# Patient Record
Sex: Male | Born: 1975 | Race: White | Hispanic: No | Smoking: Current every day smoker
Health system: Southern US, Community
[De-identification: ages and names within clinical notes are randomized; demographics above are authoritative.]

## PROBLEM LIST (undated history)

## (undated) DIAGNOSIS — K219 Gastro-esophageal reflux disease without esophagitis: Secondary | ICD-10-CM

## (undated) DIAGNOSIS — F419 Anxiety disorder, unspecified: Secondary | ICD-10-CM

## (undated) DIAGNOSIS — I10 Essential (primary) hypertension: Secondary | ICD-10-CM

## (undated) DIAGNOSIS — G4700 Insomnia, unspecified: Secondary | ICD-10-CM

## (undated) DIAGNOSIS — B019 Varicella without complication: Secondary | ICD-10-CM

## (undated) DIAGNOSIS — G4730 Sleep apnea, unspecified: Secondary | ICD-10-CM

## (undated) HISTORY — DX: Sleep apnea, unspecified: G47.30

## (undated) HISTORY — DX: Essential (primary) hypertension: I10

## (undated) HISTORY — PX: WISDOM TOOTH EXTRACTION: SHX21

## (undated) HISTORY — DX: Varicella without complication: B01.9

## (undated) HISTORY — DX: Gastro-esophageal reflux disease without esophagitis: K21.9

## (undated) HISTORY — DX: Anxiety disorder, unspecified: F41.9

## (undated) HISTORY — DX: Insomnia, unspecified: G47.00

---

## 2012-09-25 LAB — BASIC METABOLIC PANEL
CREATININE: 0.8 mg/dL (ref 0.6–1.3)
Glucose: 81 mg/dL
Potassium: 4.7 mmol/L (ref 3.4–5.3)

## 2012-09-25 LAB — HEPATIC FUNCTION PANEL
ALK PHOS: 82 U/L (ref 25–125)
ALT: 19 U/L (ref 10–40)
AST: 19 U/L (ref 14–40)

## 2012-09-25 LAB — CBC AND DIFFERENTIAL
Hemoglobin: 15.1 g/dL (ref 13.5–17.5)
PLATELETS: 252 10*3/uL (ref 150–399)
WBC: 5.9 10*3/mL

## 2012-09-25 LAB — LIPID PANEL
CHOLESTEROL: 209 mg/dL — AB (ref 0–200)
HDL: 64 mg/dL (ref 35–70)
LDL Cholesterol: 100 mg/dL
Triglycerides: 224 mg/dL — AB (ref 40–160)

## 2014-10-24 ENCOUNTER — Ambulatory Visit (INDEPENDENT_AMBULATORY_CARE_PROVIDER_SITE_OTHER): Payer: Managed Care, Other (non HMO) | Admitting: Family Medicine

## 2014-10-24 ENCOUNTER — Encounter: Payer: Self-pay | Admitting: Family Medicine

## 2014-10-24 VITALS — BP 136/89 | HR 89 | Temp 98.0°F | Ht 70.0 in | Wt 172.0 lb

## 2014-10-24 DIAGNOSIS — F411 Generalized anxiety disorder: Secondary | ICD-10-CM

## 2014-10-24 DIAGNOSIS — E785 Hyperlipidemia, unspecified: Secondary | ICD-10-CM

## 2014-10-24 DIAGNOSIS — R739 Hyperglycemia, unspecified: Secondary | ICD-10-CM

## 2014-10-24 DIAGNOSIS — F419 Anxiety disorder, unspecified: Secondary | ICD-10-CM | POA: Insufficient documentation

## 2014-10-24 DIAGNOSIS — I10 Essential (primary) hypertension: Secondary | ICD-10-CM | POA: Insufficient documentation

## 2014-10-24 DIAGNOSIS — Z0000 Encounter for general adult medical examination without abnormal findings: Secondary | ICD-10-CM

## 2014-10-24 MED ORDER — TRAZODONE HCL 50 MG PO TABS: 50.0000 mg | ORAL_TABLET | Freq: Every evening | ORAL | Status: DC | PRN

## 2014-10-24 MED ORDER — METOPROLOL SUCCINATE ER 50 MG PO TB24: 50.0000 mg | ORAL_TABLET | Freq: Every day | ORAL | Status: DC

## 2014-10-24 MED ORDER — CLONAZEPAM 0.5 MG PO TABS: 0.5000 mg | ORAL_TABLET | Freq: Every day | ORAL | Status: DC | PRN

## 2014-10-24 NOTE — Patient Instructions (Signed)
Dr. Lajoyce Lauber General Advice Following Your Complete Physical Exam  The Benefits of Regular Exercise: Unless you suffer from an uncontrolled cardiovascular condition, studies strongly suggest that regular exercise and physical activity will add to both the quality and length of your life.  The World Health Organization recommends 150 minutes of moderate intensity aerobic activity every week.  This is best split over 3-4 days a week, and can be as simple as a brisk walk for just over 35 minutes "most days of the week".  This type of exercise has been shown to lower LDL-Cholesterol, lower average blood sugars, lower blood pressure, lower cardiovascular disease risk, improve memory, and increase one's overall sense of wellbeing.  The addition of anaerobic (or "strength training") exercises offers additional benefits including but not limited to increased metabolism, prevention of osteoporosis, and improved overall cholesterol levels.  How Can I Strive For A Low-Fat Diet?: Current guidelines recommend that 25-35 percent of your daily energy (food) intake should come from fats.  One might ask how can this be achieved without having to dissect each meal on a daily basis?  Switch to skim or 1% milk instead of whole milk.  Focus on lean meats such as ground [place], fresh fish, baked chicken, and lean cuts of beef as your source of dietary protein.  Limit saturated fat consumption to less than 10% of your daily caloric intake.  Limit trans fatty acid consumption primarily by limiting synthetic trans fats such as partially hydrogenated oils (Ex: fried fast foods).  Substitute olive or vegetable oil for solid fats where possible.  Moderation of Salt Intake: Provided you don't carry a diagnosis of congestive heart failure nor renal failure, I recommend a daily allowance of no more than 2300 mg of salt (sodium).  Keeping under this daily goal is associated with a decreased risk of cardiovascular events, creeping  above it can lead to elevated blood pressures and increases your risk of cardiovascular events.  Milligrams (mg) of salt is listed on all nutrition labels, and your daily intake can add up faster than you think.  Most canned and frozen dinners can pack in over half your daily salt allowance in one meal.    Lifestyle Health Risks: Certain lifestyle choices carry specific health risks.  As you may already know, tobacco use has been associated with increasing one's risk of cardiovascular disease, pulmonary disease, numerous cancers, among many other issues.  What you may not know is that there are medications and nicotine replacement strategies that can more than double your chances of successfully quitting.  I would be thrilled to help manage your quitting strategy if you currently use tobacco products.  When it comes to alcohol use, I've yet to find an "ideal" daily allowance.  Provided an individual does not have a medical condition that is exacerbated by alcohol consumption, general guidelines determine "safe drinking" as no more than two standard drinks for a man or no more than one standard drink for a male per day.  However, much debate still exists on whether any amount of alcohol consumption is technically "safe".  My general advice, keep alcohol consumption to a minimum for general health promotion.  If you or others believe that alcohol, tobacco, or recreational drug use is interfering with your life, I would be happy to provide confidential counseling regarding treatment options.  General "Over The Counter" Nutrition Advice: Postmenopausal women should aim for a daily calcium intake of 1200 mg, however a significant portion of this might already be  provided by diets including milk, yogurt, cheese, and other dairy products.  Vitamin D has been shown to help preserve bone density, prevent fatigue, and has even been shown to help reduce falls in the elderly.  Ensuring a daily intake of 800 Units of  Vitamin D is a good place to start to enjoy the above benefits, we can easily check your Vitamin D level to see if you'd potentially benefit from supplementation beyond 800 Units a day.  Folic Acid intake should be of particular concern to women of childbearing age.  Daily consumption of 295-188 mcg of Folic Acid is recommended to minimize the chance of spinal cord defects in a fetus should pregnancy occur.    For many adults, accidents still remain one of the most common culprits when it comes to cause of death.  Some of the simplest but most effective preventitive habits you can adopt include regular seatbelt use, proper helmet use, securing firearms, and regularly testing your smoke and carbon monoxide detectors.  Zaleigh Bermingham B. [place] [address], [place], [place] [address] Phone: [phone]

## 2014-10-24 NOTE — Progress Notes (Signed)
CC: Shane Matthews is a 38 y.o. male is here for Establish Care   Subjective: HPI:  Colonoscopy: No family history of colon cancer will begin screening at age 47 Prostate: Discussed screening risks/beneifts with patient today, no family history of prostate cancer will begin screening at age 24   Influenza Vaccine: got in November Pneumovax: No current indication Td/Tdap: Tdap 2010 Zoster: (Start 40 yo)  Pleasant 38 year old here to establish care with no acute complaints  History of hypertension has only been on metoprolol with her outside blood pressures report but historically it has been well controlled on metoprolol 50 mg daily  Reports history of anxiety that he takes clonazepam for no more than once a day and often will skip days if he is not feeling particularly anxious. States anxiety does not interfere with quality of life on this regimen. This is accompanied by insomnia which he takes trazodone for, difficulty falling asleep that is also influenced by anxiety. Difficulty is alleviated if he takes 50 mg of trazodone  Review of Systems - General ROS: negative for - chills, fever, night sweats, weight gain or weight loss Ophthalmic ROS: negative for - decreased vision Psychological ROS: negative for - depression ENT ROS: negative for - hearing change, nasal congestion, tinnitus or allergies Hematological and Lymphatic ROS: negative for - bleeding problems, bruising or swollen lymph nodes Breast ROS: negative Respiratory ROS: no cough, shortness of breath, or wheezing Cardiovascular ROS: no chest pain or dyspnea on exertion Gastrointestinal ROS: no abdominal pain, change in bowel habits, or black or bloody stools Genito-Urinary ROS: negative for - genital discharge, genital ulcers, incontinence or abnormal bleeding from genitals Musculoskeletal ROS: negative for - joint pain or muscle pain Neurological ROS: negative for - headaches or memory loss Dermatological ROS: negative  for lumps, mole changes, rash and skin lesion changes  Past Medical History  Diagnosis Date  . HTN (hypertension)   . Anxiety   . Insomnia     No past surgical history on file. No family history on file.  History   Social History  . Marital Status: Divorced    Spouse Name: N/A    Number of Children: N/A  . Years of Education: N/A   Occupational History  . Not on file.   Social History Main Topics  . Smoking status: Current Every Day Smoker -- 1.00 packs/day for 20 years    Types: Cigarettes  . Smokeless tobacco: Not on file  . Alcohol Use: 0.6 - 1.8 oz/week    1-3 Not specified per week  . Drug Use: No  . Sexual Activity: Not on file   Other Topics Concern  . Not on file   Social History Narrative  . No narrative on file     Objective: BP 136/89 mmHg  Pulse 89  Temp(Src) 98 F (36.7 C)  Ht 5' 10"  (1.778 m)  Wt 172 lb (78.019 kg)  BMI 24.68 kg/m2  SpO2 97%  General: No Acute Distress HEENT: Atraumatic, normocephalic, conjunctivae normal without scleral icterus.  No nasal discharge, hearing grossly intact, TMs with good landmarks bilaterally with no middle ear abnormalities, posterior pharynx clear without oral lesions. Neck: Supple, trachea midline, no cervical nor supraclavicular adenopathy. Pulmonary: Clear to auscultation bilaterally without wheezing, rhonchi, nor rales. Cardiac: Regular rate and rhythm.  No murmurs, rubs, nor gallops. No peripheral edema.  2+ peripheral pulses bilaterally. Abdomen: Bowel sounds normal.  No masses.  Non-tender without rebound.  Negative Murphy's sign. MSK: Grossly intact, no signs of  weakness.  Full strength throughout upper and lower extremities.  Full ROM in upper and lower extremities.  No midline spinal tenderness. Neuro: Gait unremarkable, CN II-XII grossly intact.  C5-C6 Reflex 2/4 Bilaterally, L4 Reflex 2/4 Bilaterally.  Cerebellar function intact. Skin: No rashes. Psych: Alert and oriented to person/place/time.   Thought process normal. No depression however appears mildly anxious  Assessment & Plan: Coron was seen today for establish care.  Diagnoses and associated orders for this visit:  Annual physical exam - Lipid panel - COMPLETE METABOLIC PANEL WITH GFR - CBC - Hemoglobin A1c  Hyperglycemia - Hemoglobin A1c  Hyperlipidemia  Generalized anxiety disorder  Essential hypertension  Other Orders - metoprolol succinate (TOPROL-XL) 50 MG 24 hr tablet; Take 1 tablet (50 mg total) by mouth daily. Take with or immediately following a meal. - traZODone (DESYREL) 50 MG tablet; Take 1 tablet (50 mg total) by mouth at bedtime as needed for sleep. - clonazePAM (KLONOPIN) 0.5 MG tablet; Take 1 tablet (0.5 mg total) by mouth daily as needed for anxiety.    Healthy lifestyle interventions including but not limited to regular exercise, a healthy low fat diet, moderation of salt intake, the dangers of tobacco/alcohol/recreational drug use, nutrition supplementation, and accident avoidance were discussed with the patient and a handout was provided for future reference. Checking an A1c due to family history of type 2 diabetes and a elevated blood sugar just above 102 years ago.  Essential hypertension: Controlled continue metoprolol Anxiety: Controlled continue Klonopin Insomnia: Controlled continue trazodone  No Follow-up on file.

## 2015-01-15 ENCOUNTER — Encounter: Payer: Self-pay | Admitting: Family Medicine

## 2015-01-26 ENCOUNTER — Emergency Department (HOSPITAL_COMMUNITY)
Admission: EM | Admit: 2015-01-26 | Discharge: 2015-01-26 | Discharge status: Absent without leave | Payer: Managed Care, Other (non HMO) | Source: Home / Self Care

## 2015-01-30 ENCOUNTER — Encounter: Payer: Self-pay | Admitting: Family Medicine

## 2015-01-30 ENCOUNTER — Ambulatory Visit (INDEPENDENT_AMBULATORY_CARE_PROVIDER_SITE_OTHER): Payer: Managed Care, Other (non HMO) | Admitting: Family Medicine

## 2015-01-30 ENCOUNTER — Ambulatory Visit (INDEPENDENT_AMBULATORY_CARE_PROVIDER_SITE_OTHER): Payer: Managed Care, Other (non HMO)

## 2015-01-30 VITALS — BP 135/80 | HR 138 | Temp 99.0°F | Wt 169.0 lb

## 2015-01-30 DIAGNOSIS — R0602 Shortness of breath: Secondary | ICD-10-CM

## 2015-01-30 DIAGNOSIS — R509 Fever, unspecified: Secondary | ICD-10-CM

## 2015-01-30 DIAGNOSIS — R05 Cough: Secondary | ICD-10-CM

## 2015-01-30 DIAGNOSIS — R059 Cough, unspecified: Secondary | ICD-10-CM

## 2015-01-30 DIAGNOSIS — J189 Pneumonia, unspecified organism: Secondary | ICD-10-CM

## 2015-01-30 LAB — POCT INFLUENZA A/B
Influenza A, POC: NEGATIVE
Influenza B, POC: NEGATIVE

## 2015-01-30 MED ORDER — LEVOFLOXACIN 500 MG PO TABS: 750.0000 mg | ORAL_TABLET | Freq: Every day | ORAL | Status: DC

## 2015-01-30 MED ORDER — HYDROCODONE-HOMATROPINE 5-1.5 MG/5ML PO SYRP: 5.0000 mL | ORAL_SOLUTION | Freq: Three times a day (TID) | ORAL | Status: DC | PRN

## 2015-01-30 NOTE — Progress Notes (Signed)
CC: Shane Matthews is a 39 y.o. male is here for fever since friday   Subjective: HPI:  Productive cough and fever that has been present since Thursday of last week. Temperature has gotten high as 102.5. Reports fatigue, chills, decreased appetite but able to tolerate fluids. Symptoms have been moderate in severity and always been doing the sitting at home. Difficulty staying asleep due to the above symptoms waking him up. No benefit from ibuprofen or Tylenol. No other interventions as of yet. Symptoms began 2-3 days after he returned from [place], no other travel or known sick contacts. Denies shortness of breath however is significant other today tells me that he is visibly out of breath doing easy household tasks. He denies chest pain, confusion, nasal congestion, sore throat, facial pressure, rashes, diarrhea constipation nor burning when urinating.   Review Of Systems Outlined In HPI  Past Medical History  Diagnosis Date  . HTN (hypertension)   . Anxiety   . Insomnia     No past surgical history on file. No family history on file.  History   Social History  . Marital Status: Divorced    Spouse Name: N/A  . Number of Children: N/A  . Years of Education: N/A   Occupational History  . Not on file.   Social History Main Topics  . Smoking status: Current Every Day Smoker -- 1.00 packs/day for 20 years    Types: Cigarettes  . Smokeless tobacco: Not on file  . Alcohol Use: 0.6 - 1.8 oz/week    1-3 Standard drinks or equivalent per week  . Drug Use: No  . Sexual Activity: Not on file   Other Topics Concern  . Not on file   Social History Narrative     Objective: BP 135/80 mmHg  Pulse 138  Temp(Src) 99 F (37.2 C) (Oral)  Wt 169 lb (76.658 kg)  General: Alert and Oriented, No Acute Distress but appears moderately tired HEENT: Pupils equal, round, reactive to light. Conjunctivae clear.  External ears unremarkable, canals clear with intact TMs with appropriate  landmarks.  Middle ear appears open without effusion. Pink inferior turbinates.  Moist mucous membranes, pharynx without inflammation nor lesions.  Neck supple without palpable lymphadenopathy nor abnormal masses. Lungs: Clear to auscultation bilaterally, no wheezing/ronchi/rales.  Good air movement Cardiac: mild tachycardia at a regular rhythm. Normal S1/S2.  No murmurs, rubs, nor gallops.   Extremities: No peripheral edema.  Strong peripheral pulses.  Mental Status: No depression, anxiety, nor agitation. Skin: Warm and dry.  Assessment & Plan: [name] was seen today for fever since friday.  Diagnoses and all orders for this visit:  Fever, unspecified fever cause Orders: -     POCT Influenza A/B -     DG Chest 2 View; Future  Shortness of breath Orders: -     DG Chest 2 View; Future  Cough Orders: -     DG Chest 2 View; Future  CAP (community acquired pneumonia) Orders: -     levofloxacin (LEVAQUIN) 500 MG tablet; Take 1.5 tablets (750 mg total) by mouth daily. -     HYDROcodone-homatropine (HYCODAN) 5-1.5 MG/5ML syrup; Take 5 mLs by mouth every 8 (eight) hours as needed for cough.   Rapid flu test negative, chest x-ray was obtained which shows bilateral pulmonary infiltrates. He'll be started on levofloxacin for community-acquired pneumonia. Will also be provided with Hycodan to help with cough that's interfering with sleep. I've asked him to call me if he is absolutely no  better or worse on Thursday and if that's the case I be happy to work him into my schedule even I'm already booked.Signs and symptoms requring emergent/urgent reevaluation were discussed with the patient.   I'd be happy to complete FMLA paperwork to keep him out of work from Monday through Thursday of this week.  Return if symptoms worsen or fail to improve.

## 2015-02-02 ENCOUNTER — Telehealth: Payer: Self-pay | Admitting: *Deleted

## 2015-02-02 NOTE — Telephone Encounter (Signed)
Sent message to Dr Ileene Rubens concerning patient still havin fevers 2-3 x day

## 2015-02-02 NOTE — Telephone Encounter (Signed)
As long as he is physically feeling somewhat better and shortness of breath is improving a fever for the first three days is not out of the ordinary.  I'd recommend he continue with the levofloxacin and if still having fevers over the weekend visit our urgent care clinic since we won't be open.

## 2015-02-02 NOTE — Telephone Encounter (Signed)
Spoke with patient & informed that Dr Ileene Rubens says: As long as he is physically feeling somewhat better and shortness of breath is improving a fever for the first three days is not out of the ordinary. I'd recommend he continue with the levofloxacin and if still having fevers over the weekend visit our urgent care clinic since we won't be open.

## 2015-05-11 ENCOUNTER — Other Ambulatory Visit: Payer: Self-pay | Admitting: *Deleted

## 2015-05-11 MED ORDER — METOPROLOL SUCCINATE ER 50 MG PO TB24: 50.0000 mg | ORAL_TABLET | Freq: Every day | ORAL | Status: DC

## 2015-05-29 ENCOUNTER — Ambulatory Visit (INDEPENDENT_AMBULATORY_CARE_PROVIDER_SITE_OTHER): Payer: Managed Care, Other (non HMO) | Admitting: Family Medicine

## 2015-05-29 ENCOUNTER — Encounter: Payer: Self-pay | Admitting: Family Medicine

## 2015-05-29 VITALS — BP 171/103 | HR 81 | Wt 166.0 lb

## 2015-05-29 DIAGNOSIS — F411 Generalized anxiety disorder: Secondary | ICD-10-CM | POA: Diagnosis not present

## 2015-05-29 DIAGNOSIS — I10 Essential (primary) hypertension: Secondary | ICD-10-CM

## 2015-05-29 MED ORDER — CLONAZEPAM 0.5 MG PO TABS: 0.5000 mg | ORAL_TABLET | Freq: Every day | ORAL | Status: DC | PRN

## 2015-05-29 MED ORDER — TRAZODONE HCL 50 MG PO TABS: 50.0000 mg | ORAL_TABLET | Freq: Every evening | ORAL | Status: DC | PRN

## 2015-05-29 NOTE — Progress Notes (Signed)
CC: Shane Matthews is a 39 y.o. male is here for Anxiety   Subjective: HPI:  Essential hypertension: Taking metoprolol 50 mg on a daily basis. No outside blood pressures to report. No chest pain shortness of breath orthopnea nor peripheral edema.  Follow-up anxiety: He continues to take clonazepam a few days a week. He's tried to go 2 weeks now without taking it to see if he still needs to be on it and almost every other day he gets stressed out at work and feels like he probably should take a dose of it. He's done with this experiment and wants to go back to no longer restricting himself from this medication. He does well if he takes just a few times a week. He noticed that trazodone seemed to stop helping withstaying asleep so he stopped taking it a few months ago.denies depression, nor paranoia.  Review Of Systems Outlined In HPI  Past Medical History  Diagnosis Date  . HTN (hypertension)   . Anxiety   . Insomnia     No past surgical history on file. No family history on file.  History   Social History  . Marital Status: Divorced    Spouse Name: N/A  . Number of Children: N/A  . Years of Education: N/A   Occupational History  . Not on file.   Social History Main Topics  . Smoking status: Current Every Day Smoker -- 1.00 packs/day for 20 years    Types: Cigarettes  . Smokeless tobacco: Not on file  . Alcohol Use: 0.6 - 1.8 oz/week    1-3 Standard drinks or equivalent per week  . Drug Use: No  . Sexual Activity: Not on file   Other Topics Concern  . Not on file   Social History Narrative     Objective: BP 171/103 mmHg  Pulse 81  Wt 166 lb (75.297 kg)  General: Alert and Oriented, No Acute Distress HEENT: Pupils equal, round, reactive to light. Conjunctivae clear.  Moist mucous membranes Lungs: Clear to auscultation bilaterally, no wheezing/ronchi/rales.  Comfortable work of breathing. Good air movement. Cardiac: Regular rate and rhythm. Normal S1/S2.  No  murmurs, rubs, nor gallops.   Extremities: No peripheral edema.  Strong peripheral pulses.  Mental Status: No depression, anxiety, nor agitation. Skin: Warm and dry.  Assessment & Plan: Shane Matthews was seen today for anxiety.  Diagnoses and all orders for this visit:  Essential hypertension  Generalized anxiety disorder  Other orders -     traZODone (DESYREL) 50 MG tablet; Take 1-2 tablets (50-100 mg total) by mouth at bedtime as needed for sleep. -     clonazePAM (KLONOPIN) 0.5 MG tablet; Take 1 tablet (0.5 mg total) by mouth daily as needed for anxiety.   Essential hypertension: Uncontrolled chronic condition, he strongly believes that his blood pressure is normal outside of our office. To test this have asked him to write down his blood pressure on 4 separate days and provided to me within the next 1 or 2 weeks to help me determine if he needs to go up on his metoprolol. Anxiety: Controlled with clonazepam, component of insomnia as uncontrolled therefore increasing trazodone  Return in about 3 months (around 08/29/2015).

## 2015-05-29 NOTE — Patient Instructions (Signed)
Please write down four random blood pressures from four separate days:               .

## 2015-09-10 ENCOUNTER — Other Ambulatory Visit: Payer: Self-pay | Admitting: Family Medicine

## 2015-10-02 ENCOUNTER — Ambulatory Visit (INDEPENDENT_AMBULATORY_CARE_PROVIDER_SITE_OTHER): Payer: Managed Care, Other (non HMO) | Admitting: Family Medicine

## 2015-10-02 ENCOUNTER — Encounter: Payer: Self-pay | Admitting: Family Medicine

## 2015-10-02 VITALS — BP 160/97 | HR 76 | Wt 175.0 lb

## 2015-10-02 DIAGNOSIS — I10 Essential (primary) hypertension: Secondary | ICD-10-CM | POA: Diagnosis not present

## 2015-10-02 DIAGNOSIS — F411 Generalized anxiety disorder: Secondary | ICD-10-CM | POA: Diagnosis not present

## 2015-10-02 MED ORDER — CLONAZEPAM 0.5 MG PO TABS: 0.5000 mg | ORAL_TABLET | Freq: Every day | ORAL | Status: DC | PRN

## 2015-10-02 MED ORDER — METOPROLOL SUCCINATE ER 100 MG PO TB24: 100.0000 mg | ORAL_TABLET | Freq: Every day | ORAL | Status: DC

## 2015-10-02 NOTE — Progress Notes (Signed)
CC: Shane Matthews is a 39 y.o. male is here for Hypertension and Medication Refill   Subjective: HPI:   Follow-up anxiety:  His only been taking clonazepam on stressful days when he is at work. He typically does not take it on the weekend. They'll obtain the trigger his anxiety is the stress at work. Symptoms are not interfering with his quality of life with his current use of this medication. He denies any depression or sleep disturbance. Denies unintentional weight loss or gain.  Follow-up essential hypertension: Continues to take metoprolol 50 mg daily. Denies any known side effects. Denies chest pain shortness of breath orthopnea nor peripheral edema. He's noticed that blood pressures are usually in the stage I hypertension range when checked at home.   Review Of Systems Outlined In HPI  Past Medical History  Diagnosis Date  . HTN (hypertension)   . Anxiety   . Insomnia     No past surgical history on file. No family history on file.  Social History   Social History  . Marital Status: Divorced    Spouse Name: N/A  . Number of Children: N/A  . Years of Education: N/A   Occupational History  . Not on file.   Social History Main Topics  . Smoking status: Current Every Day Smoker -- 1.00 packs/day for 20 years    Types: Cigarettes  . Smokeless tobacco: Not on file  . Alcohol Use: 0.6 - 1.8 oz/week    1-3 Standard drinks or equivalent per week  . Drug Use: No  . Sexual Activity: Not on file   Other Topics Concern  . Not on file   Social History Narrative     Objective: BP 160/97 mmHg  Pulse 76  Wt 175 lb (79.379 kg)  General: Alert and Oriented, No Acute Distress HEENT: Pupils equal, round, reactive to light. Conjunctivae clear. Moist mucous membranes Lungs: Clear to auscultation bilaterally, no wheezing/ronchi/rales.  Comfortable work of breathing. Good air movement. Cardiac: Regular rate and rhythm. Normal S1/S2.  No murmurs, rubs, nor gallops.    Extremities: No peripheral edema.  Strong peripheral pulses.  Mental Status: No depression, anxiety, nor agitation. Skin: Warm and dry.  Assessment & Plan: Shane Matthews was seen today for hypertension and medication refill.  Diagnoses and all orders for this visit:  Generalized anxiety disorder  Essential hypertension  Other orders -     clonazePAM (KLONOPIN) 0.5 MG tablet; Take 1 tablet (0.5 mg total) by mouth daily as needed for anxiety. -     Cancel: metoprolol succinate (TOPROL-XL) 50 MG 24 hr tablet; Take with or immediately following a meal. -     metoprolol succinate (TOPROL-XL) 100 MG 24 hr tablet; Take 1 tablet (100 mg total) by mouth daily. Take with or immediately following a meal.   Anxiety: Controlled continue as needed clonazepam Essential hypertension: uncontrolled chronic condition increasing metoprolol.  Return for December or January Fasting Physical.

## 2015-11-09 ENCOUNTER — Encounter: Payer: Managed Care, Other (non HMO) | Admitting: Family Medicine

## 2015-11-19 ENCOUNTER — Encounter: Payer: Self-pay | Admitting: Family Medicine

## 2015-11-19 ENCOUNTER — Ambulatory Visit (INDEPENDENT_AMBULATORY_CARE_PROVIDER_SITE_OTHER): Payer: Managed Care, Other (non HMO) | Admitting: Family Medicine

## 2015-11-19 VITALS — BP 151/98 | HR 68 | Wt 176.0 lb

## 2015-11-19 DIAGNOSIS — Z0000 Encounter for general adult medical examination without abnormal findings: Secondary | ICD-10-CM | POA: Diagnosis not present

## 2015-11-19 MED ORDER — METOPROLOL SUCCINATE ER 100 MG PO TB24: 100.0000 mg | ORAL_TABLET | Freq: Every day | ORAL | Status: DC

## 2015-11-19 MED ORDER — CLONAZEPAM 0.5 MG PO TABS: 0.5000 mg | ORAL_TABLET | Freq: Every day | ORAL | Status: DC | PRN

## 2015-11-19 NOTE — Progress Notes (Signed)
CC: Shane Matthews is a 40 y.o. male is here for Annual Exam   Subjective: HPI:  Colonoscopy: no current indication Prostate: Discussed screening risks/beneifts with patienttoday, no current indication for screening  Influenza Vaccine: UTD Pneumovax:  Td/Tdap: UTD Zoster: (Start 42 yo)  Requesting complete physical exam with no complaints but requesting refills on metoprolol and clonazepam.  Review of Systems - General ROS: negative for - chills, fever, night sweats, weight gain or weight loss Ophthalmic ROS: negative for - decreased vision Psychological ROS: negative for - anxiety or depression ENT ROS: negative for - hearing change, nasal congestion, tinnitus or allergies Hematological and Lymphatic ROS: negative for - bleeding problems, bruising or swollen lymph nodes Breast ROS: negative Respiratory ROS: no cough, shortness of breath, or wheezing Cardiovascular ROS: no chest pain or dyspnea on exertion Gastrointestinal ROS: no abdominal pain, change in bowel habits, or black or bloody stools Genito-Urinary ROS: negative for - genital discharge, genital ulcers, incontinence or abnormal bleeding from genitals Musculoskeletal ROS: negative for - joint pain or muscle pain Neurological ROS: negative for - headaches or memory loss Dermatological ROS: negative for lumps, mole changes, rash and skin lesion changes  Past Medical History  Diagnosis Date  . HTN (hypertension)   . Anxiety   . Insomnia     No past surgical history on file. No family history on file.  Social History   Social History  . Marital Status: Divorced    Spouse Name: N/A  . Number of Children: N/A  . Years of Education: N/A   Occupational History  . Not on file.   Social History Main Topics  . Smoking status: Current Every Day Smoker -- 1.00 packs/day for 20 years    Types: Cigarettes  . Smokeless tobacco: Not on file  . Alcohol Use: 0.6 - 1.8 oz/week    1-3 Standard drinks or equivalent per week   . Drug Use: No  . Sexual Activity: Not on file   Other Topics Concern  . Not on file   Social History Narrative     Objective: BP 151/98 mmHg  Pulse 68  Wt 176 lb (79.833 kg)  General: No Acute Distress HEENT: Atraumatic, normocephalic, conjunctivae normal without scleral icterus.  No nasal discharge, hearing grossly intact, TMs with good landmarks bilaterally with no middle ear abnormalities, posterior pharynx clear without oral lesions. Neck: Supple, trachea midline, no cervical nor supraclavicular adenopathy. Pulmonary: Clear to auscultation bilaterally without wheezing, rhonchi, nor rales. Cardiac: Regular rate and rhythm.  No murmurs, rubs, nor gallops. No peripheral edema.  2+ peripheral pulses bilaterally. Abdomen: Bowel sounds normal.  No masses.  Non-tender without rebound.  Negative Murphy's sign. MSK: Grossly intact, no signs of weakness.  Full strength throughout upper and lower extremities.  Full ROM in upper and lower extremities.  No midline spinal tenderness. Neuro: Gait unremarkable, CN II-XII grossly intact.  C5-C6 Reflex 2/4 Bilaterally, L4 Reflex 2/4 Bilaterally.  Cerebellar function intact. Skin: No rashes. Psych: Alert and oriented to person/place/time.  Thought process normal. No anxiety/depression. Assessment & Plan: Fidel was seen today for annual exam.  Diagnoses and all orders for this visit:  Annual physical exam -     Lipid panel -     COMPLETE METABOLIC PANEL WITH GFR -     CBC  Other orders -     clonazePAM (KLONOPIN) 0.5 MG tablet; Take 1 tablet (0.5 mg total) by mouth daily as needed for anxiety. -     metoprolol succinate (TOPROL-XL)  100 MG 24 hr tablet; Take 1 tablet (100 mg total) by mouth daily. Take with or immediately following a meal.   Healthy lifestyle interventions including but not limited to regular exercise, a healthy low fat diet, moderation of salt intake, the dangers of tobacco/alcohol/recreational drug use, nutrition  supplementation, and accident avoidance were discussed with the patient and a handout was provided for future reference.  No Follow-up on file.

## 2015-11-20 LAB — LIPID PANEL
Cholesterol: 222 mg/dL — ABNORMAL HIGH (ref 125–200)
HDL: 57 mg/dL (ref >=40)
LDL CALC: 123 mg/dL (ref <130)
Total CHOL/HDL Ratio: 3.9 Ratio (ref <=5.0)
Triglycerides: 208 mg/dL — ABNORMAL HIGH (ref <150)
VLDL: 42 mg/dL — ABNORMAL HIGH (ref <30)

## 2015-11-20 LAB — CBC
HCT: 41.5 % (ref 39.0–52.0)
Hemoglobin: 14.2 g/dL (ref 13.0–17.0)
MCH: 31.6 pg (ref 26.0–34.0)
MCHC: 34.2 g/dL (ref 30.0–36.0)
MCV: 92.4 fL (ref 78.0–100.0)
MPV: 9.2 fL (ref 8.6–12.4)
Platelets: 256 10*3/uL (ref 150–400)
RBC: 4.49 MIL/uL (ref 4.22–5.81)
RDW: 12.9 % (ref 11.5–15.5)
WBC: 9.5 10*3/uL (ref 4.0–10.5)

## 2015-11-20 LAB — COMPLETE METABOLIC PANEL WITH GFR
ALT: 19 U/L (ref 9–46)
AST: 19 U/L (ref 10–40)
Albumin: 4.5 g/dL (ref 3.6–5.1)
Alkaline Phosphatase: 75 U/L (ref 40–115)
BILIRUBIN TOTAL: 0.5 mg/dL (ref 0.2–1.2)
BUN: 15 mg/dL (ref 7–25)
CALCIUM: 9.5 mg/dL (ref 8.6–10.3)
CO2: 27 mmol/L (ref 20–31)
Chloride: 104 mmol/L (ref 98–110)
Creat: 0.89 mg/dL (ref 0.60–1.35)
GFR, Est African American: >89 mL/min (ref >=60)
GFR, Est Non African American: >89 mL/min (ref >=60)
Glucose, Bld: 85 mg/dL (ref 65–99)
Potassium: 4.4 mmol/L (ref 3.5–5.3)
SODIUM: 138 mmol/L (ref 135–146)
TOTAL PROTEIN: 7 g/dL (ref 6.1–8.1)

## 2016-06-18 ENCOUNTER — Ambulatory Visit (INDEPENDENT_AMBULATORY_CARE_PROVIDER_SITE_OTHER): Payer: Managed Care, Other (non HMO) | Admitting: Family Medicine

## 2016-06-18 ENCOUNTER — Encounter: Payer: Self-pay | Admitting: Family Medicine

## 2016-06-18 VITALS — BP 157/106 | HR 72 | Wt 185.0 lb

## 2016-06-18 DIAGNOSIS — F411 Generalized anxiety disorder: Secondary | ICD-10-CM | POA: Diagnosis not present

## 2016-06-18 DIAGNOSIS — I10 Essential (primary) hypertension: Secondary | ICD-10-CM

## 2016-06-18 MED ORDER — LISINOPRIL-HYDROCHLOROTHIAZIDE 20-12.5 MG PO TABS: 1.0000 | ORAL_TABLET | Freq: Every day | ORAL | 1 refills | Status: DC

## 2016-06-18 MED ORDER — CLONAZEPAM 0.5 MG PO TABS: 0.5000 mg | ORAL_TABLET | Freq: Every day | ORAL | 1 refills | Status: DC | PRN

## 2016-06-18 NOTE — Progress Notes (Signed)
CC: Shane Matthews is a 40 y.o. male is here for Hypertension and Medication Refill   Subjective: HPI:  Follow-up anxiety: Decreased take clonazepam is working. 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 He denies any known sedation or side effects from his medication other than helping him not feel overwhelmed at work.  He doesn't seem to get any anxiety on the weekends when he is not working  Follow essential hypertension: He is noticing that his blood pressures are in the stage I hypertensive range at home. He is taking metoprolol with 100% compliance but feels like it's causing him to have some fatigue soon after taking it. He denies any chest pain shortness of breath orthopnea or peripheral edema   Review Of Systems Outlined In HPI  Past Medical History:  Diagnosis Date  . Anxiety   . HTN (hypertension)   . Insomnia     No past surgical history on file. No family history on file.  Social History   Social History  . Marital status: Divorced    Spouse name: N/A  . Number of children: N/A  . Years of education: N/A   Occupational History  . Not on file.   Social History Main Topics  . Smoking status: Current Every Day Smoker    Packs/day: 1.00    Years: 20.00    Types: Cigarettes  . Smokeless tobacco: Not on file  . Alcohol use 0.6 - 1.8 oz/week    1 - 3 Standard drinks or equivalent per week  . Drug use: No  . Sexual activity: Not on file   Other Topics Concern  . Not on file   Social History Narrative  . No narrative on file     Objective: BP (!) 157/106   Pulse 72   Wt 185 lb (83.9 kg)   BMI 26.54 kg/m   General: Alert and Oriented, No Acute Distress HEENT: Pupils equal, round, reactive to light. Conjunctivae clear.  Moist mucous membranes Lungs: Clear to auscultation bilaterally, no wheezing/ronchi/rales.  Comfortable work of breathing. Good air movement. Cardiac: Regular rate and rhythm. Normal S1/S2.  No murmurs,  rubs, nor gallops.   Extremities: No peripheral edema.  Strong peripheral pulses.  Mental Status: No depression, anxiety, nor agitation. Skin: Warm and dry.  Assessment & Plan: [name] was seen today for hypertension and medication refill.  Diagnoses and all orders for this visit:  Generalized anxiety disorder  Essential hypertension  Other orders -     clonazePAM (KLONOPIN) 0.5 MG tablet; Take 1 tablet (0.5 mg total) by mouth daily as needed for anxiety. -     Cancel: metoprolol succinate (TOPROL-XL) 100 MG 24 hr tablet; Take 1 tablet (100 mg total) by mouth daily. Take with or immediately following a meal. -     lisinopril-hydrochlorothiazide (ZESTORETIC) 20-12.5 MG tablet; Take 1 tablet by mouth daily.   Anxiety: Currently controlled with clonazepam Essential hypertension: Uncontrolled chronic condition switching to lisinopril-hydrochlorothiazide. Follow-up within a few weeks if blood pressure remains above 140/90, otherwise follow-up in 3 months  Discussed with this patient that I will be resigning from my position here with [facility] in September in order to stay with my family who will be moving to [facility]. I let him know about the providers that are still accepting patients and I feel that this individual will be under great care if he/she stays here with [facility].  Return in about 3 months (around 09/18/2016).

## 2016-11-13 ENCOUNTER — Encounter: Payer: Self-pay | Admitting: Family Medicine

## 2016-11-13 ENCOUNTER — Ambulatory Visit (INDEPENDENT_AMBULATORY_CARE_PROVIDER_SITE_OTHER): Payer: BLUE CROSS/BLUE SHIELD | Admitting: Family Medicine

## 2016-11-13 ENCOUNTER — Other Ambulatory Visit: Payer: Self-pay

## 2016-11-13 VITALS — BP 120/76 | HR 101 | Temp 98.3°F | Ht 70.0 in | Wt 181.2 lb

## 2016-11-13 DIAGNOSIS — R0683 Snoring: Secondary | ICD-10-CM | POA: Diagnosis not present

## 2016-11-13 DIAGNOSIS — F17200 Nicotine dependence, unspecified, uncomplicated: Secondary | ICD-10-CM

## 2016-11-13 DIAGNOSIS — E785 Hyperlipidemia, unspecified: Secondary | ICD-10-CM

## 2016-11-13 DIAGNOSIS — F411 Generalized anxiety disorder: Secondary | ICD-10-CM

## 2016-11-13 DIAGNOSIS — I10 Essential (primary) hypertension: Secondary | ICD-10-CM

## 2016-11-13 MED ORDER — BUPROPION HCL ER (XL) 150 MG PO TB24: 150.0000 mg | ORAL_TABLET | Freq: Every day | ORAL | 5 refills | Status: DC

## 2016-11-13 NOTE — Progress Notes (Signed)
Pre visit review using our clinic review tool, if applicable. No additional management support is needed unless otherwise documented below in the visit note. 

## 2016-11-13 NOTE — Assessment & Plan Note (Signed)
S: high triglycerides noted on labs A/P: not exercising, eating out a lot. Went over lowering triglycerides handout. Healthy lifestyle counseling

## 2016-11-13 NOTE — Patient Instructions (Addendum)
We will call you within a week about your referral to pulmonology for sleep apnea testing. If you do not hear within 2 weeks, give [place] a call.   Call [place] [facility]  Start wellbutrin 172m XL to help with quitting smoking. See me in 1 month so we can check in on your quitting progress/anxiety. We can potentially refill clonazepam at that time.   Blood pressure looks great

## 2016-11-13 NOTE — Assessment & Plan Note (Signed)
S:Heavy snoring/pauses in breathing from fiancee. Daytimes sleepiness/nodding off A/P: refer to pulmonology for sleep apnea evaluation

## 2016-11-13 NOTE — Assessment & Plan Note (Addendum)
S: wellbutrin has helped him quit in past, back up to 1/4 ppd smoking A/P: retrial wellbutrin, follow up 1 month

## 2016-11-13 NOTE — Assessment & Plan Note (Signed)
S: Has tried wellbutrin, cymbalta, lexapro, paxil, zoloft. SSRI/SNRI all with SE of sexual issues. Wellbutrin helped some and helped with quitting smoking. On clonazepam 0.99m daily. Feels like anxiety level is creeping up A/P: [facility] he htinks counseling would help. Wellbutrin may also give modest benefit and may help him quitsmoking so restarted

## 2016-11-13 NOTE — Progress Notes (Signed)
Phone: [phone]  Subjective:  Patient presents today to establish care as prior patient of Dr. Ileene Rubens in [place]. Chief complaint-noted.   See problem oriented charting  The following were reviewed and entered/updated in epic: Past Medical History:  Diagnosis Date  . Anxiety   . HTN (hypertension)   . Insomnia    Patient Active Problem List   Diagnosis Date Noted  . Hyperglycemia 10/24/2014    Priority: Medium  . Hyperlipidemia 10/24/2014    Priority: Medium  . Generalized anxiety disorder 10/24/2014    Priority: Medium  . Essential hypertension 10/24/2014    Priority: Medium   No past surgical history on file.  No family history on file.  Medications- reviewed and updated Current Outpatient Prescriptions  Medication Sig Dispense Refill  . clonazePAM (KLONOPIN) 0.5 MG tablet Take 1 tablet (0.5 mg total) by mouth daily as needed for anxiety. 90 tablet 1  . lisinopril-hydrochlorothiazide (ZESTORETIC) 20-12.5 MG tablet Take 1 tablet by mouth daily. 90 tablet 1   No current facility-administered medications for this visit.     Allergies-reviewed and updated No Known Allergies  Social History   Social History  . Marital status: Divorced    Spouse name: N/A  . Number of children: N/A  . Years of education: N/A   Social History Main Topics  . Smoking status: Current Every Day Smoker    Packs/day: 1.00    Years: 20.00    Types: Cigarettes  . Smokeless tobacco: None  . Alcohol use 0.6 - 1.8 oz/week    1 - 3 Standard drinks or equivalent per week  . Drug use: No  . Sexual activity: Not Asked   Other Topics Concern  . None   Social History Narrative  . None    ROS--Full ROS was completed Review of Systems  Constitutional: Negative for chills and fever.  HENT: Negative for hearing loss and tinnitus.   Eyes: Negative for blurred vision and double vision.  Respiratory: Negative for cough and hemoptysis.   Cardiovascular: Negative for chest pain  and palpitations.  Gastrointestinal: Negative for heartburn and nausea.  Genitourinary: Negative for dysuria and urgency.  Musculoskeletal: Negative for myalgias and neck pain.  Skin: Negative for itching and rash.  Neurological: Negative for dizziness and headaches.  Endo/Heme/Allergies: Negative for environmental allergies. Does not bruise/bleed easily.  Psychiatric/Behavioral: Negative for depression, hallucinations and substance abuse. The patient is nervous/anxious.    Objective: BP 120/76 (BP Location: Left Arm, Patient Position: Sitting, Cuff Size: Large)   Pulse (!) 101   Temp 98.3 F (36.8 C) (Oral)   Ht 5' 10"  (1.778 m)   Wt 181 lb 3.2 oz (82.2 kg)   SpO2 96%   BMI 26.00 kg/m  Gen: NAD, resting comfortably HEENT: Mucous membranes are moist. Oropharynx normal. TM normal. Eyes: sclera and lids normal, PERRLA Neck: no thyromegaly, no cervical lymphadenopathy CV: RRR no murmurs rubs or gallops Lungs: CTAB no crackles, wheeze, rhonchi Abdomen: soft/nontender/nondistended/normal bowel sounds. No rebound or guarding.  Ext: no edema Skin: warm, dry Neuro: 5/5 strength in upper and lower extremities, normal gait, normal reflexes Anxious appearing at times  Assessment/Plan:  Current smoker S: wellbutrin has helped him quit in past, back up to 1/4 ppd smoking A/P: retrial wellbutrin, follow up 1 month   Snoring S:Heavy snoring/pauses in breathing from fiancee. Daytimes sleepiness/nodding off A/P: refer to pulmonology for sleep apnea evaluation  Essential hypertension S: controlled on lisinopril-hctz 20-12.22m. Was on metoprolol before and not controlled BP Readings from  Last 3 Encounters:  11/13/16 120/76  06/18/16 (!) 157/106  11/19/15 (!) 151/98  A/P:Continue current meds:  Doing much better   Hyperlipidemia S: high triglycerides noted on labs A/P: not exercising, eating out a lot. Went over lowering triglycerides handout. Healthy lifestyle  counseling   Generalized anxiety disorder S: Has tried wellbutrin, cymbalta, lexapro, paxil, zoloft. SSRI/SNRI all with SE of sexual issues. Wellbutrin helped some and helped with quitting smoking. On clonazepam 0.29m daily. Feels like anxiety level is creeping up A/P: [facility] would help. Wellbutrin may also give modest benefit and may help him quitsmoking so restarted   Return in about 4 weeks (around 12/11/2016).   Return precautions advised.  SGarret Reddish MD

## 2016-11-13 NOTE — Assessment & Plan Note (Signed)
S: controlled on lisinopril-hctz 20-12.43m. Was on metoprolol before and not controlled BP Readings from Last 3 Encounters:  11/13/16 120/76  06/18/16 (!) 157/106  11/19/15 (!) 151/98  A/P:Continue current meds:  Doing much better

## 2016-12-15 ENCOUNTER — Encounter: Payer: Self-pay | Admitting: Family Medicine

## 2016-12-15 ENCOUNTER — Ambulatory Visit (INDEPENDENT_AMBULATORY_CARE_PROVIDER_SITE_OTHER): Payer: BLUE CROSS/BLUE SHIELD | Admitting: Family Medicine

## 2016-12-15 VITALS — BP 128/86 | HR 98 | Temp 98.4°F | Ht 70.0 in | Wt 183.2 lb

## 2016-12-15 DIAGNOSIS — F17200 Nicotine dependence, unspecified, uncomplicated: Secondary | ICD-10-CM

## 2016-12-15 DIAGNOSIS — F411 Generalized anxiety disorder: Secondary | ICD-10-CM

## 2016-12-15 DIAGNOSIS — I10 Essential (primary) hypertension: Secondary | ICD-10-CM

## 2016-12-15 MED ORDER — CLONAZEPAM 0.5 MG PO TABS: 0.5000 mg | ORAL_TABLET | Freq: Every day | ORAL | 1 refills | Status: DC | PRN

## 2016-12-15 MED ORDER — LISINOPRIL-HYDROCHLOROTHIAZIDE 20-12.5 MG PO TABS: 1.0000 | ORAL_TABLET | Freq: Every day | ORAL | 1 refills | Status: DC

## 2016-12-15 NOTE — Patient Instructions (Addendum)
No changes today

## 2016-12-15 NOTE — Progress Notes (Signed)
Pre visit review using our clinic review tool, if applicable. No additional management support is needed unless otherwise documented below in the visit note. 

## 2016-12-15 NOTE — Assessment & Plan Note (Signed)
S: 1/4 ppd smoaking last visit- we started wellbutrin. Some days has not smoked at all. Work days are hardest for him. 1 pack per 5-7 days. Urge his decreased desire.  A/P: encouraged complete cessation he think sthe wellbutrin will help him get there so wants to continue

## 2016-12-15 NOTE — Progress Notes (Signed)
Subjective:  Shane Matthews is a 41 y.o. year old very pleasant male patient who presents for/with See problem oriented charting ROS- No chest pain or shortness of breath. No headache or blurry vision.  Dizziness but jus tat onset of wellbutrin now resolved   Past Medical History-  Patient Active Problem List   Diagnosis Date Noted  . Current smoker 11/13/2016    Priority: High  . Hyperlipidemia 10/24/2014    Priority: Medium  . Generalized anxiety disorder 10/24/2014    Priority: Medium  . Essential hypertension 10/24/2014    Priority: Medium  . Snoring 11/13/2016    Priority: Low    Medications- reviewed and updated Current Outpatient Prescriptions  Medication Sig Dispense Refill  . buPROPion (WELLBUTRIN XL) 150 MG 24 hr tablet Take 1 tablet (150 mg total) by mouth daily. 30 tablet 5  . clonazePAM (KLONOPIN) 0.5 MG tablet Take 1 tablet (0.5 mg total) by mouth daily as needed for anxiety. 90 tablet 1  . lisinopril-hydrochlorothiazide (ZESTORETIC) 20-12.5 MG tablet Take 1 tablet by mouth daily. 90 tablet 1   No current facility-administered medications for this visit.     Objective: BP 128/86 (BP Location: Left Arm, Patient Position: Sitting, Cuff Size: Large)   Pulse 98   Temp 98.4 F (36.9 C) (Oral)   Ht 5' 10"  (1.778 m)   Wt 183 lb 3.2 oz (83.1 kg)   SpO2 97%   BMI 26.29 kg/m  Gen: NAD, resting comfortably CV: RRR no murmurs rubs or gallops Lungs: CTAB no crackles, wheeze, rhonchi  Ext: no edema  Assessment/Plan:  Current smoker S: 1/4 ppd smoaking last visit- we started wellbutrin. Some days has not smoked at all. Work days are hardest for him. 1 pack per 5-7 days. Urge his decreased desire.  A/P: encouraged complete cessation he think sthe wellbutrin will help him get there so wants to continue  Generalized anxiety disorder S: wellbutrin started last visit to help him quit smoking but with some hopesit may help with GAD as well. Also gave handout for  behavioral health. He had also been on clonzepam 0.976m a day. Prior attempts on cymbalta, lexapro, paxil, zoloft- stopped due to sexual SE for most part. He did have some slight dizzy spells at beginning of starting wellbutrin which have completely resolved  Thinks he feels more relaxed overall on the wellbutrin. Continuing his one a day clonazepam. May miss dose on weekend and feel ok- needs during the week.   [facility] x2 from Dr. HIleene Rubensin last 9 months  A/P: as has not tolerated first line SSRI and wellbutrin not sufficient enough- did agree to refill his clonazepam 0.540mfor daily prn use (prefer 5-6 pills per week max)  Essential hypertension S: controlled on lisinopril HCT 20-12.76m64m BP Readings from Last 3 Encounters:  12/15/16 128/86  11/13/16 120/76  06/18/16 (!) 157/106  A/P:Continue current meds:  Refilled as doing so well   Return in about 6 months (around 06/14/2017) for follow up- or sooner if needed.  Meds ordered this encounter  Medications  . lisinopril-hydrochlorothiazide (ZESTORETIC) 20-12.5 MG tablet    Sig: Take 1 tablet by mouth daily.    Dispense:  90 tablet    Refill:  1  . clonazePAM (KLONOPIN) 0.5 MG tablet    Sig: Take 1 tablet (0.5 mg total) by mouth daily as needed for anxiety.    Dispense:  90 tablet    Refill:  1    Return  precautions advised.  Garret Reddish, MD

## 2016-12-15 NOTE — Assessment & Plan Note (Signed)
S: wellbutrin started last visit to help him quit smoking but with some hopesit may help with GAD as well. Also gave handout for behavioral health. He had also been on clonzepam 0.76m a day. Prior attempts on cymbalta, lexapro, paxil, zoloft- stopped due to sexual SE for most part. He did have some slight dizzy spells at beginning of starting wellbutrin which have completely resolved  Thinks he feels more relaxed overall on the wellbutrin. Continuing his one a day clonazepam. May miss dose on weekend and feel ok- needs during the week.   [facility]irmed [facility] #90 x2 from Dr. HIleene Rubensin last 9 months  A/P: as has not tolerated first line SSRI and wellbutrin not sufficient enough- did agree to refill his clonazepam 0.555mfor daily prn use (prefer 5-6 pills per week max)

## 2016-12-15 NOTE — Assessment & Plan Note (Signed)
S: controlled on lisinopril HCT 20-12.68m.  BP Readings from Last 3 Encounters:  12/15/16 128/86  11/13/16 120/76  06/18/16 (!) 157/106  A/P:Continue current meds:  Refilled as doing so well

## 2017-01-02 ENCOUNTER — Ambulatory Visit (INDEPENDENT_AMBULATORY_CARE_PROVIDER_SITE_OTHER): Payer: BLUE CROSS/BLUE SHIELD | Admitting: Pulmonary Disease

## 2017-01-02 ENCOUNTER — Encounter: Payer: Self-pay | Admitting: Pulmonary Disease

## 2017-01-02 VITALS — BP 122/80 | HR 77 | Ht 70.0 in | Wt 185.2 lb

## 2017-01-02 DIAGNOSIS — G4710 Hypersomnia, unspecified: Secondary | ICD-10-CM

## 2017-01-02 NOTE — Patient Instructions (Signed)
It was a pleasure taking care of you today!  We will schedule you to have a sleep study to determine if you have sleep apnea.   We will get a home sleep test.  You will be instructed to come back to the office to get an apparatus to sleep with overnight.  Once we have the apparatus, it will usually take [place] 1-2 weeks to read the study and get back at you with results of the test.  Please give [place] a call in 2 weeks after your study if you do not hear back from [place].    If the sleep study is positive, we will order you a CPAP  machine.  Please call the office if you do NOT receive your machine in the next 1-2 weeks.   Please make sure you use your CPAP device everytime you sleep.  We will monitor the usage of your machine per your insurance requirement.  Your insurance company may take the machine from you if you are not using it regularly.   Please clean the mask, tubings, filter, water reservoir with soapy water every week.  Please use distilled water for the water reservoir.   Please call the office or your machine provider (DME company) if you are having issues with the device.   Return to clinic in 8-10 weeks with Dr. Corrie Dandy or NP

## 2017-01-02 NOTE — Progress Notes (Signed)
Subjective:    Patient ID: Shane Matthews, male    DOB: 10-05-1976, 41 y.o.   MRN: [id number]  HPI   This is the case of Taeshawn Helfman, 41 y.o. Male, who was referred by Dr. Garret Reddish in consultation regarding possible OSA.    As you very well know, patient has been smoking, mostly a PPD, started cutting down recently, not known to have asthma or copd.   Patient has snoring, witnessed apneas, gasping, choking, frequent awakenings. Has hypersomnia in am.  Has unrefreshed sleep.  He anxiety with work. 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 (-) abnormal behavior in sleep.   ESS 15.   Pt has anxiety at work but this is somewhat controlled.   Hypersomnia affects his fxnality.    Review of Systems  Constitutional: Negative.  Negative for fever and unexpected weight change.  HENT: Negative.  Negative for congestion, dental problem, ear pain, nosebleeds, postnasal drip, rhinorrhea, sinus pressure, sneezing, sore throat and trouble swallowing.   Eyes: Negative.  Negative for redness and itching.  Respiratory: Negative.  Negative for cough, chest tightness, shortness of breath and wheezing.   Cardiovascular: Negative.  Negative for palpitations and leg swelling.  Gastrointestinal: Negative.  Negative for nausea and vomiting.  Endocrine: Negative.   Genitourinary: Negative.  Negative for dysuria.  Musculoskeletal: Negative.  Negative for joint swelling.  Skin: Negative.  Negative for rash.  Allergic/Immunologic: Negative.  Negative for environmental allergies, food allergies and immunocompromised state.  Neurological: Negative.  Negative for headaches.  Hematological: Negative.  Does not bruise/bleed easily.  Psychiatric/Behavioral: Negative.  Negative for dysphoric mood. The patient is not nervous/anxious.    Past Medical History:  Diagnosis Date  . Anxiety   . Chicken pox   . GERD (gastroesophageal reflux disease)    sporadic  . HTN  (hypertension)   . Insomnia    (-) CA, DVT  Family History  Problem Relation Age of Onset  . Stroke Mother     68 at death  . Diabetes Mother   . Hypertension Mother   . Stroke Father     20 at death  . Lung cancer Father     long term smoker  . Skin cancer Father   . Healthy Sister     61 years older than him  . Healthy Brother      Past Surgical History:  Procedure Laterality Date  . WISDOM TOOTH EXTRACTION      Social History   Social History  . Marital status: Divorced    Spouse name: N/A  . Number of children: N/A  . Years of education: N/A   Occupational History  . Not on file.   Social History Main Topics  . Smoking status: Current Every Day Smoker    Packs/day: 0.25    Years: 20.00    Types: Cigarettes  . Smokeless tobacco: Never Used  . Alcohol use 4.8 - 6.0 oz/week    8 - 10 Standard drinks or equivalent per week  . Drug use: No  . Sexual activity: Yes    Birth control/ protection: Injection   Other Topics Concern  . Not on file   Social History Narrative   Divorced. With fiancee since 3066. 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    HS degree      Hobbies: golf, fish, travel[place]beach, [place],  enjoys all sports particular hockey and [employer]     No Known Allergies   Outpatient Medications Prior to Visit  Medication Sig Dispense Refill  . buPROPion (WELLBUTRIN XL) 150 MG 24 hr tablet Take 1 tablet (150 mg total) by mouth daily. 30 tablet 5  . clonazePAM (KLONOPIN) 0.5 MG tablet Take 1 tablet (0.5 mg total) by mouth daily as needed for anxiety. 90 tablet 1  . lisinopril-hydrochlorothiazide (ZESTORETIC) 20-12.5 MG tablet Take 1 tablet by mouth daily. 90 tablet 1   No facility-administered medications prior to visit.    No orders of the defined types were placed in this encounter.       Objective:   Physical Exam   Vitals:  Vitals:    01/02/17 1639  BP: 122/80  Pulse: 77  SpO2: 97%  Weight: 185 lb 3.2 oz (84 kg)  Height: 5' 10"  (1.778 m)    Constitutional/General:  Pleasant, well-nourished, well-developed, not in any distress,  Comfortably seating.  Well kempt  Body mass index is 26.57 kg/m. Wt Readings from Last 3 Encounters:  01/02/17 185 lb 3.2 oz (84 kg)  12/15/16 183 lb 3.2 oz (83.1 kg)  11/13/16 181 lb 3.2 oz (82.2 kg)    HEENT: Pupils equal and reactive to light and accommodation. Anicteric sclerae. Normal nasal mucosa.   No oral  lesions,  mouth clear,  oropharynx clear, no postnasal drip. (-) Oral thrush. No dental caries. Short neck. Mild retrognathia.  Airway - Mallampati class III  Neck: No masses. Midline trachea. No JVD, (-) LAD. (-) bruits appreciated.  Respiratory/Chest: Grossly normal chest. (-) deformity. (-) Accessory muscle use.  Symmetric expansion. (-) Tenderness on palpation.  Resonant on percussion.  Diminished BS on both lower lung zones. (-) wheezing, crackles, rhonchi (-) egophony  Cardiovascular: Regular rate and  rhythm, heart sounds normal, no murmur or gallops, no peripheral edema  Gastrointestinal:  Normal bowel sounds. Soft, non-tender. No hepatosplenomegaly.  (-) masses.   Musculoskeletal:  Normal muscle tone. Normal gait.   Extremities: Grossly normal. (-) clubbing, cyanosis.  (-) edema  Skin: (-) rash,lesions seen.   Neurological/Psychiatric : alert, oriented to time, place, person. Normal mood and affect         Assessment & Plan:  Hypersomnia Patient has snoring, witnessed apneas, gasping, choking, frequent awakenings. Has hypersomnia in am.  Has unrefreshed sleep.  He anxiety with work. 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 (-) abnormal behavior in sleep.   ESS 15.   Pt has anxiety at work but this is somewhat controlled.   Hypersomnia affects his fxnality.   Plan :  We discussed about the diagnosis of  Obstructive Sleep Apnea (OSA) and implications of untreated OSA. We discussed about CPAP and BiPaP as possible treatment options.    We will schedule the patient for a sleep study. Plan for a split night sleep study.  Likely moderate.  Has stress/anxiety at work but is manageable.  Anticipate no issues with cpap.    Patient was instructed to call the office if he/she has not heard back from the office 1-2 weeks after the sleep study.   Patient was instructed to call the office if he/she is having issues with the PAP device.   We discussed good sleep hygiene.   Patient was advised not to engage in activities requiring concentration and/or vigilance if he/she is sleepy.  Patient was advised not to drive if he/she is sleepy.  Thank you very much for letting me participate in this patient's care. Please do not hesitate to give me a call if you have any questions or concerns regarding the treatment plan.   Patient will follow up with me in 8-10 weeks.     Monica Becton, MD 01/03/2017   6:31 AM Pulmonary and [place] Pager: [phone] Office: [phone], Fax: [phone]

## 2017-01-03 DIAGNOSIS — G4710 Hypersomnia, unspecified: Secondary | ICD-10-CM | POA: Insufficient documentation

## 2017-01-03 DIAGNOSIS — G4700 Insomnia, unspecified: Secondary | ICD-10-CM | POA: Insufficient documentation

## 2017-01-03 NOTE — Assessment & Plan Note (Signed)
Patient has snoring, witnessed apneas, gasping, choking, frequent awakenings. Has hypersomnia in am.  Has unrefreshed sleep.  He anxiety with work. 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 (-) abnormal behavior in sleep.   ESS 15.   Pt has anxiety at work but this is somewhat controlled.   Hypersomnia affects his fxnality.   Plan :  We discussed about the diagnosis of Obstructive Sleep Apnea (OSA) and implications of untreated OSA. We discussed about CPAP and BiPaP as possible treatment options.    We will schedule the patient for a sleep study. Plan for a split night sleep study.  Likely moderate.  Has stress/anxiety at work but is manageable.  Anticipate no issues with cpap.    Patient was instructed to call the office if he/she has not heard back from the office 1-2 weeks after the sleep study.   Patient was instructed to call the office if he/she is having issues with the PAP device.   We discussed good sleep hygiene.   Patient was advised not to engage in activities requiring concentration and/or vigilance if he/she is sleepy.  Patient was advised not to drive if he/she is sleepy.

## 2017-01-26 DIAGNOSIS — G4733 Obstructive sleep apnea (adult) (pediatric): Secondary | ICD-10-CM | POA: Diagnosis not present

## 2017-02-04 ENCOUNTER — Telehealth: Payer: Self-pay | Admitting: Pulmonary Disease

## 2017-02-04 DIAGNOSIS — G4733 Obstructive sleep apnea (adult) (pediatric): Secondary | ICD-10-CM

## 2017-02-04 NOTE — Telephone Encounter (Signed)
    Please call the pt and tell the pt the [place]  showed OSA  Pt stops breathing 40   times an hour.   Home sleep study was done on : 01/26/17  Please order autoCPAP 5-15 cm H2O. Patient will need a mask fitting session. Patient will need a 1 month download.   Patient needs to be seen by me or any of the NPs/APPs  4-6 weeks after obtaining the cpap machine. Let me know if you receive this.   Thanks!   J. Shirl Harris, MD 02/04/2017, 1:51 PM

## 2017-02-05 ENCOUNTER — Other Ambulatory Visit: Payer: Self-pay | Admitting: *Deleted

## 2017-02-05 DIAGNOSIS — G4710 Hypersomnia, unspecified: Secondary | ICD-10-CM

## 2017-02-05 DIAGNOSIS — G4733 Obstructive sleep apnea (adult) (pediatric): Secondary | ICD-10-CM | POA: Diagnosis not present

## 2017-02-05 NOTE — Telephone Encounter (Signed)
Spoke with pt, aware of results/recs.  cpap ordered.  Pt already scheduled for rov with AD on 5/17.  Nothing further needed.

## 2017-02-05 NOTE — Telephone Encounter (Signed)
lmomtcb x1 

## 2017-02-19 ENCOUNTER — Telehealth: Payer: Self-pay

## 2017-02-19 NOTE — Telephone Encounter (Signed)
Spoke with patient and I scheduled him for next Thursday, 02/26/17 at 4:00.

## 2017-02-19 NOTE — Telephone Encounter (Signed)
Called patient's cell number listed as we received a request from [facility] for a Chantix Starter Pack. He needs an appointment to discuss with Dr. Yong Channel prior to starting medication. I did leave a message asking for a return phone call.

## 2017-02-26 ENCOUNTER — Ambulatory Visit: Payer: BLUE CROSS/BLUE SHIELD | Admitting: Family Medicine

## 2017-02-27 DIAGNOSIS — G4733 Obstructive sleep apnea (adult) (pediatric): Secondary | ICD-10-CM | POA: Diagnosis not present

## 2017-03-09 ENCOUNTER — Ambulatory Visit: Payer: BLUE CROSS/BLUE SHIELD | Admitting: Family Medicine

## 2017-03-19 ENCOUNTER — Ambulatory Visit: Payer: BLUE CROSS/BLUE SHIELD | Admitting: Pulmonary Disease

## 2017-03-25 ENCOUNTER — Encounter: Payer: Self-pay | Admitting: Pulmonary Disease

## 2017-03-27 ENCOUNTER — Ambulatory Visit (INDEPENDENT_AMBULATORY_CARE_PROVIDER_SITE_OTHER): Payer: BLUE CROSS/BLUE SHIELD | Admitting: Pulmonary Disease

## 2017-03-27 ENCOUNTER — Encounter: Payer: Self-pay | Admitting: Pulmonary Disease

## 2017-03-27 DIAGNOSIS — F411 Generalized anxiety disorder: Secondary | ICD-10-CM | POA: Diagnosis not present

## 2017-03-27 DIAGNOSIS — G4733 Obstructive sleep apnea (adult) (pediatric): Secondary | ICD-10-CM | POA: Insufficient documentation

## 2017-03-27 NOTE — Assessment & Plan Note (Addendum)
Patient has snoring, witnessed apneas, gasping, choking, frequent awakenings. Has hypersomnia in am.  Has unrefreshed sleep.  He anxiety with work. 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 (-) abnormal behavior in sleep.   ESS 15.   Pt has anxiety at work but this is somewhat controlled.   Hypersomnia affects his fxnality.   Patient had a home sleep study in March 2018 which showed an AHI of 40. He was started on CPAP therapy. He has been using it every night since he got it. Feels better using it. More energy. Less sleepiness. DL the last month : AHI 7. He was having leak issues earlier as well as the pressure might not be enough. A lot of times, he is on 14-15 centimeters water.  Plan :  We extensively discussed the importance of treating OSA and the need to use PAP therapy.   Continue with cpap therapy >> will ask his DME to increase settings to 5-16 centimeters water from 5-15 centimeters water. Plan to get a download in one month and let patient know results. If AHI is better, we'll continue current settings. If it's worse, he may end up needing a CPAP/BiPAP titration study.   Patient was instructed to have mask, tubings, filter, reservoir cleaned at least once a week with soapy water.  Patient was instructed to call the office if he/she is having issues with the PAP device.    I advised patient to obtain sufficient amount of sleep --  7 to 8 hours at least in a 24 hr period.  Patient was advised to follow good sleep hygiene.  Patient was advised NOT to engage in activities requiring concentration and/or vigilance if he/she is and  sleepy.  Patient is NOT to drive if he/she is sleepy.

## 2017-03-27 NOTE — Progress Notes (Signed)
Subjective:    Patient ID: Shane Matthews, male    DOB: 1976/08/16, 41 y.o.   MRN: [id number]  HPI   This is the case of Shane Matthews, 41 y.o. Male, who was referred by Dr. Garret Reddish in consultation regarding possible OSA.    As you very well know, patient has been smoking, mostly a PPD, started cutting down recently, not known to have asthma or copd.   Patient has snoring, witnessed apneas, gasping, choking, frequent awakenings. Has hypersomnia in am.  Has unrefreshed sleep.  He anxiety with work. 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 (-) abnormal behavior in sleep.   ESS 15.   Pt has anxiety at work but this is somewhat controlled.   Hypersomnia affects his fxnality.   ROV 03/27/2017 Patient returns to the office as follow-up on his sleep apnea. Since last seen, he had a home sleep study done in March which showed an AHI of 40. He was started on CPAP therapy. Feels better using it. More energy. Less sleepiness. Download the last month: AHI was 7 likely related to mask leak issues and inadequate pressure.  He was mostly on cpap 14-15 cm water. He is using CPAP every night since he got it.  Review of Systems  Constitutional: Negative.  Negative for fever and unexpected weight change.  HENT: Negative.  Negative for congestion, dental problem, ear pain, nosebleeds, postnasal drip, rhinorrhea, sinus pressure, sneezing, sore throat and trouble swallowing.   Eyes: Negative.  Negative for redness and itching.  Respiratory: Negative.  Negative for cough, chest tightness, shortness of breath and wheezing.   Cardiovascular: Negative.  Negative for palpitations and leg swelling.  Gastrointestinal: Negative.  Negative for nausea and vomiting.  Endocrine: Negative.   Genitourinary: Negative.  Negative for dysuria.  Musculoskeletal: Negative.  Negative for joint swelling.  Skin: Negative.  Negative for rash.  Allergic/Immunologic: Negative.   Negative for environmental allergies, food allergies and immunocompromised state.  Neurological: Negative.  Negative for headaches.  Hematological: Negative.  Does not bruise/bleed easily.  Psychiatric/Behavioral: Negative.  Negative for dysphoric mood. The patient is not nervous/anxious.        Objective:   Physical Exam   Vitals:  Vitals:   03/27/17 1345  BP: 122/72  Pulse: (!) 107  SpO2: 98%  Weight: 184 lb 9.6 oz (83.7 kg)  Height: 5' 10"  (1.778 m)    Constitutional/General:  Pleasant, well-nourished, well-developed, not in any distress,  Comfortably seating.  Well kempt  Body mass index is 26.49 kg/m. Wt Readings from Last 3 Encounters:  03/27/17 184 lb 9.6 oz (83.7 kg)  01/02/17 185 lb 3.2 oz (84 kg)  12/15/16 183 lb 3.2 oz (83.1 kg)    HEENT: Pupils equal and reactive to light and accommodation. Anicteric sclerae. Normal nasal mucosa.   No oral  lesions,  mouth clear,  oropharynx clear, no postnasal drip. (-) Oral thrush. No dental caries. Short neck. Mild retrognathia.  Airway - Mallampati class III  Neck: No masses. Midline trachea. No JVD, (-) LAD. (-) bruits appreciated.  Respiratory/Chest: Grossly normal chest. (-) deformity. (-) Accessory muscle use.  Symmetric expansion. (-) Tenderness on palpation.  Resonant on percussion.  Diminished BS on both lower lung zones. (-) wheezing, crackles, rhonchi (-) egophony  Cardiovascular: Regular rate and  rhythm, heart sounds normal, no murmur or gallops, no peripheral edema  Gastrointestinal:  Normal bowel sounds. Soft, non-tender. No hepatosplenomegaly.  (-) masses.   Musculoskeletal:  Normal muscle tone. Normal gait.   Extremities: Grossly normal. (-) clubbing, cyanosis.  (-) edema  Skin: (-) rash,lesions seen.   Neurological/Psychiatric : alert, oriented to time, place, person. Normal mood and affect         Assessment & Plan:  OSA (obstructive sleep apnea) Patient has snoring, witnessed  apneas, gasping, choking, frequent awakenings. Has hypersomnia in am.  Has unrefreshed sleep.  He anxiety with work. 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 (-) abnormal behavior in sleep.   ESS 15.   Pt has anxiety at work but this is somewhat controlled.   Hypersomnia affects his fxnality.   Patient had a home sleep study in March 2018 which showed an AHI of 40. He was started on CPAP therapy. He has been using it every night since he got it. Feels better using it. More energy. Less sleepiness. DL the last month : AHI 7. He was having leak issues earlier as well as the pressure might not be enough. A lot of times, he is on 14-15 centimeters water.  Plan :  We extensively discussed the importance of treating OSA and the need to use PAP therapy.   Continue with cpap therapy >> will ask his DME to increase settings to 5-16 centimeters water from 5-15 centimeters water. Plan to get a download in one month and let patient know results. If AHI is better, we'll continue current settings. If it's worse, he may end up needing a CPAP/BiPAP titration study.   Patient was instructed to have mask, tubings, filter, reservoir cleaned at least once a week with soapy water.  Patient was instructed to call the office if he/she is having issues with the PAP device.    I advised patient to obtain sufficient amount of sleep --  7 to 8 hours at least in a 24 hr period.  Patient was advised to follow good sleep hygiene.  Patient was advised NOT to engage in activities requiring concentration and/or vigilance if he/she is and  sleepy.  Patient is NOT to drive if he/she is sleepy.    Generalized anxiety disorder Patient continues to have anxiety and difficulty maintaining sleep related to this rather than sleep apnea. I recommended a sedating antidepressant such as Remeron or doxepin. He will let his primary care doctor know.    Return to clinic in 1 yr, sooner if with  issues.      Monica Becton, MD 03/27/2017   2:27 PM Pulmonary and [place] Pager: [phone] Office: [phone], Fax: [phone]

## 2017-03-27 NOTE — Patient Instructions (Signed)
  It was a pleasure taking care of you today!  Continue using your CPAP machine. We will call your company and try to increase to pressure settings (change to 5-16 cm water from 5-15 cm water). We will get a 1 month download on a new settings and call you with results.  Please make sure you use your CPAP device everytime you sleep.  We will monitor the usage of your machine per your insurance requirement.  Your insurance company may take the machine from you if you are not using it regularly.   Please clean the mask, tubings, filter, water reservoir with soapy water every week.  Please use distilled water for the water reservoir.   Please call the office or your machine provider (DME company) if you are having issues with the device.   Return to clinic in 1 year with NP.

## 2017-03-27 NOTE — Assessment & Plan Note (Signed)
Patient continues to have anxiety and difficulty maintaining sleep related to this rather than sleep apnea. I recommended a sedating antidepressant such as Remeron or doxepin. He will let his primary care doctor know.

## 2017-03-29 DIAGNOSIS — G4733 Obstructive sleep apnea (adult) (pediatric): Secondary | ICD-10-CM | POA: Diagnosis not present

## 2017-04-29 DIAGNOSIS — G4733 Obstructive sleep apnea (adult) (pediatric): Secondary | ICD-10-CM | POA: Diagnosis not present

## 2017-05-08 ENCOUNTER — Other Ambulatory Visit (INDEPENDENT_AMBULATORY_CARE_PROVIDER_SITE_OTHER): Payer: BLUE CROSS/BLUE SHIELD

## 2017-05-08 DIAGNOSIS — Z114 Encounter for screening for human immunodeficiency virus [HIV]: Secondary | ICD-10-CM | POA: Diagnosis not present

## 2017-05-08 DIAGNOSIS — E785 Hyperlipidemia, unspecified: Secondary | ICD-10-CM | POA: Diagnosis not present

## 2017-05-08 LAB — LIPID PANEL
CHOLESTEROL: 208 mg/dL — AB (ref 0–200)
HDL: 55.2 mg/dL (ref >39.00)
LDL CALC: 131 mg/dL — AB (ref 0–99)
NonHDL: 152.49
TRIGLYCERIDES: 105 mg/dL (ref 0.0–149.0)
Total CHOL/HDL Ratio: 4
VLDL: 21 mg/dL (ref 0.0–40.0)

## 2017-05-08 LAB — COMPREHENSIVE METABOLIC PANEL
ALK PHOS: 64 U/L (ref 39–117)
ALT: 21 U/L (ref 0–53)
AST: 20 U/L (ref 0–37)
Albumin: 4.6 g/dL (ref 3.5–5.2)
BUN: 11 mg/dL (ref 6–23)
CHLORIDE: 103 meq/L (ref 96–112)
CO2: 29 mEq/L (ref 19–32)
CREATININE: 1.01 mg/dL (ref 0.40–1.50)
Calcium: 9.5 mg/dL (ref 8.4–10.5)
GFR: 85.58 mL/min (ref >60.00)
Glucose, Bld: 88 mg/dL (ref 70–99)
POTASSIUM: 4.4 meq/L (ref 3.5–5.1)
Sodium: 139 mEq/L (ref 135–145)
TOTAL PROTEIN: 7.3 g/dL (ref 6.0–8.3)
Total Bilirubin: 0.6 mg/dL (ref 0.2–1.2)

## 2017-05-08 LAB — CBC WITH DIFFERENTIAL/PLATELET
BASOS PCT: 0.6 % (ref 0.0–3.0)
Basophils Absolute: 0 10*3/uL (ref 0.0–0.1)
EOS ABS: 0.3 10*3/uL (ref 0.0–0.7)
Eosinophils Relative: 4.4 % (ref 0.0–5.0)
HCT: 41.7 % (ref 39.0–52.0)
HEMOGLOBIN: 14.3 g/dL (ref 13.0–17.0)
LYMPHS ABS: 2.5 10*3/uL (ref 0.7–4.0)
Lymphocytes Relative: 31.9 % (ref 12.0–46.0)
MCHC: 34.3 g/dL (ref 30.0–36.0)
MCV: 92.9 fl (ref 78.0–100.0)
MONO ABS: 0.8 10*3/uL (ref 0.1–1.0)
Monocytes Relative: 10.3 % (ref 3.0–12.0)
NEUTROS PCT: 52.8 % (ref 43.0–77.0)
Neutro Abs: 4.2 10*3/uL (ref 1.4–7.7)
PLATELETS: 265 10*3/uL (ref 150.0–400.0)
RBC: 4.49 Mil/uL (ref 4.22–5.81)
RDW: 13.2 % (ref 11.5–15.5)
WBC: 7.9 10*3/uL (ref 4.0–10.5)

## 2017-05-08 LAB — POC URINALSYSI DIPSTICK (AUTOMATED)
Bilirubin, UA: NEGATIVE
Blood, UA: NEGATIVE
GLUCOSE UA: NEGATIVE
Ketones, UA: NEGATIVE
LEUKOCYTES UA: NEGATIVE
NITRITE UA: NEGATIVE
PROTEIN UA: NEGATIVE
Spec Grav, UA: 1.01 (ref 1.010–1.025)
UROBILINOGEN UA: 0.2 U/dL
pH, UA: 6 (ref 5.0–8.0)

## 2017-05-09 LAB — HIV ANTIBODY (ROUTINE TESTING W REFLEX): HIV: NONREACTIVE

## 2017-05-15 ENCOUNTER — Ambulatory Visit (INDEPENDENT_AMBULATORY_CARE_PROVIDER_SITE_OTHER): Payer: BLUE CROSS/BLUE SHIELD | Admitting: Family Medicine

## 2017-05-15 ENCOUNTER — Encounter: Payer: Self-pay | Admitting: Family Medicine

## 2017-05-15 VITALS — BP 118/84 | HR 91 | Temp 98.5°F | Ht 70.0 in | Wt 179.6 lb

## 2017-05-15 DIAGNOSIS — Z0000 Encounter for general adult medical examination without abnormal findings: Secondary | ICD-10-CM | POA: Diagnosis not present

## 2017-05-15 DIAGNOSIS — F411 Generalized anxiety disorder: Secondary | ICD-10-CM | POA: Diagnosis not present

## 2017-05-15 DIAGNOSIS — F17200 Nicotine dependence, unspecified, uncomplicated: Secondary | ICD-10-CM | POA: Diagnosis not present

## 2017-05-15 DIAGNOSIS — E785 Hyperlipidemia, unspecified: Secondary | ICD-10-CM

## 2017-05-15 MED ORDER — CLONAZEPAM 0.5 MG PO TABS: 0.5000 mg | ORAL_TABLET | Freq: Every day | ORAL | 1 refills | Status: DC | PRN

## 2017-05-15 MED ORDER — VARENICLINE TARTRATE 0.5 MG X 11 & 1 MG X 42 PO MISC: ORAL | 0 refills | Status: DC

## 2017-05-15 MED ORDER — DOXEPIN HCL 10 MG PO CAPS: 10.0000 mg | ORAL_CAPSULE | Freq: Every evening | ORAL | 5 refills | Status: DC | PRN

## 2017-05-15 NOTE — Patient Instructions (Signed)
chantix trial. Update me in 3 weeks and can do some continuing monthly packs  Trial doxepin for sleep. I was going to use 6 mg dose but it was not covered wheras 80m appears to be covered  Follow up 3 months. Hopefully counseling will help as well

## 2017-05-15 NOTE — Assessment & Plan Note (Signed)
Smoking- 1/4 PPD last visit on wellbutrin. Work days hardest for him. Seems to be creeping up. Stopped wellbutrin. Trial chantix. Reach out to [place] if helping and not affecting anxiety and can give continuing packs x 2-3

## 2017-05-15 NOTE — Addendum Note (Signed)
Addended by: Marin Olp on: 05/15/2017 02:07 PM   Modules accepted: Orders

## 2017-05-15 NOTE — Assessment & Plan Note (Signed)
10 year ascvd risk of 4.5 %. Advised smoking cessation to reduce risk.

## 2017-05-15 NOTE — Assessment & Plan Note (Signed)
GAD 7 elevated at 16- has failed extensive list of SSRI, SNRI, wellbutrin alone. He is using klonopin 0.72m daily prn (discussed hopefully 5-6 per week maximum)- he has had to useit at night instead of the day to help him sleep. Dr. DTennis Mustdios recommended option such as remeron or doxepin- 2nd option preferred to avoid weight gain. [facility] Trial doxepin - 6 mg not covered per epic so will trial 131m 3 month follow up

## 2017-05-15 NOTE — Progress Notes (Signed)
Phone: [phone]  Subjective:  Patient presents today for their annual physical. Chief complaint-noted.   See problem oriented charting- ROS- full  review of systems was completed and negative except for: poor sleep, anxiety.   The following were reviewed and entered/updated in epic: Past Medical History:  Diagnosis Date  . Anxiety   . Chicken pox   . GERD (gastroesophageal reflux disease)    sporadic  . HTN (hypertension)   . Insomnia    Patient Active Problem List   Diagnosis Date Noted  . Current smoker 11/13/2016    Priority: High  . Hyperlipidemia 10/24/2014    Priority: Medium  . Generalized anxiety disorder 10/24/2014    Priority: Medium  . Essential hypertension 10/24/2014    Priority: Medium  . Snoring 11/13/2016    Priority: Low  . OSA (obstructive sleep apnea) 03/27/2017  . Hypersomnia 01/03/2017   Past Surgical History:  Procedure Laterality Date  . WISDOM TOOTH EXTRACTION      Family History  Problem Relation Age of Onset  . Stroke Mother        54 at death  . Diabetes Mother   . Hypertension Mother   . Stroke Father        20 at death  . Lung cancer Father        long term smoker  . Skin cancer Father   . Healthy Sister        27 years older than him  . Healthy Brother     Medications- reviewed and updated Current Outpatient Prescriptions  Medication Sig Dispense Refill  . clonazePAM (KLONOPIN) 0.5 MG tablet Take 1 tablet (0.5 mg total) by mouth daily as needed for anxiety. 90 tablet 1  . lisinopril-hydrochlorothiazide (ZESTORETIC) 20-12.5 MG tablet Take 1 tablet by mouth daily. 90 tablet 1   No current facility-administered medications for this visit.     Allergies-reviewed and updated No Known Allergies  Social History   Social History  . Marital status: Divorced    Spouse name: N/A  . Number of children: N/A  . Years of education: N/A   Social History Main Topics  . Smoking status: Current Every Day Smoker    Packs/day:  0.25    Years: 20.00    Types: Cigarettes  . Smokeless tobacco: Never Used  . Alcohol use 4.8 - 6.0 oz/week    8 - 10 Standard drinks or equivalent per week  . Drug use: No  . Sexual activity: Yes    Birth control/ protection: Injection   Other Topics Concern  . None   Social History Narrative   Divorced. With fiancee since 7257. 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    HS degree      Hobbies: golf, fish, travel-beach, [place], enjoys all sports particular hockey and [employer]    Objective: BP 118/84 (BP Location: Left Arm, Patient Position: Sitting, Cuff Size: Large)   Pulse 91   Temp 98.5 F (36.9 C) (Oral)   Ht 5' 10"  (1.778 m)   Wt 179 lb 9.6 oz (81.5 kg)   SpO2 94%   BMI 25.77 kg/m  Gen: NAD, resting comfortably HEENT: Mucous membranes are moist. Oropharynx normal Neck: no thyromegaly CV: RRR no murmurs rubs or gallops Lungs: CTAB no crackles, wheeze, rhonchi Abdomen: soft/nontender/nondistended/normal bowel sounds. No rebound or guarding. overweight Ext: no edema Skin: warm, dry Neuro: grossly normal, moves  all extremities, PERRLA  Assessment/Plan:  41 y.o. male presenting for annual physical.  Health Maintenance counseling: 1. Anticipatory guidance: Patient counseled regarding regular dental exams q109month, eye exams yearly, wearing seatbelts.  2. Risk factor reduction:  Advised patient of need for regular exercise and diet rich and fruits and vegetables to reduce risk of heart attack and stroke. Exercise- was walking in spring and winter- tough now with being hotter- had been walking 2 miles a day- encouraged to find something he can do indoors or go outdoors 150 minutes a week. Diet-water and unsweet tea and some diet drinks. Could increase plant based portion of diet.  3. Immunizations/screenings/ancillary studies Immunization History  Administered Date(s)  Administered  . Influenza-Unspecified 09/03/2014, 08/04/2015, 09/17/2016  . Tdap 11/03/2008  4. Prostate cancer screening-  no family history, start at age 43-55 5 Colon cancer screening - no family history, start at age 43-50 6 Skin cancer screening/prevention- advised regular sunscreen use- considering dermatology in the future 7. Testicular cancer screening- advised monthly self exams - already doing this 8. STD screening- patient opts out- monogamous with fiancee  Status of chronic or acute concerns   HTN- at goal on lisinopri HCT 20-12.5 mg BP Readings from Last 3 Encounters:  05/15/17 118/84  03/27/17 122/72  01/02/17 122/80   OSA now on cpap  Current smoker Smoking- 1/4 PPD last visit on wellbutrin. Work days hardest for him. Seems to be creeping up. Stopped wellbutrin. Trial chantix. Reach out to [place] helping and not affecting anxiety and can give continuing packs x 2-3  Generalized anxiety disorder GAD 7 elevated at 16- has failed extensive list of SSRI, SNRI, wellbutrin alone. He is using klonopin 0.518mdaily prn (discussed hopefully 5-6 per week maximum)- he has had to useit at night instead of the day to help him sleep. Dr. DeTennis Mustios recommended option such as remeron or doxepin- 2nd option preferred to avoid weight gain. [facility] not [facility] per epic so will trial 1048m3 month follow up  Hyperlipidemia 10 year ascvd risk of 4.5 %. Advised smoking cessation to reduce risk.   3 months  Meds ordered this encounter  Medications  . varenicline (CHANTIX STARTING MONTH PAK) 0.5 MG X 11 & 1 MG X 42 tablet    Sig: Take one 0.5 mg tablet by mouth once daily for 3 days, then increase to one 0.5 mg tablet twice daily for 4 days, then increase to one 1 mg tablet twice daily.    Dispense:  53 tablet    Refill:  0  . doxepin (SINEQUAN) 10 MG capsule    Sig: Take 1 capsule (10 mg total) by mouth at bedtime as needed.     Dispense:  30 capsule    Refill:  5    Return precautions advised.   SteGarret ReddishD

## 2017-05-20 ENCOUNTER — Ambulatory Visit: Payer: BLUE CROSS/BLUE SHIELD | Admitting: Psychology

## 2017-05-29 ENCOUNTER — Ambulatory Visit (INDEPENDENT_AMBULATORY_CARE_PROVIDER_SITE_OTHER): Payer: BLUE CROSS/BLUE SHIELD | Admitting: Psychology

## 2017-05-29 DIAGNOSIS — F411 Generalized anxiety disorder: Secondary | ICD-10-CM | POA: Diagnosis not present

## 2017-05-29 DIAGNOSIS — G4733 Obstructive sleep apnea (adult) (pediatric): Secondary | ICD-10-CM | POA: Diagnosis not present

## 2017-06-10 ENCOUNTER — Other Ambulatory Visit: Payer: Self-pay | Admitting: Family Medicine

## 2017-06-15 ENCOUNTER — Encounter: Payer: Self-pay | Admitting: Family Medicine

## 2017-06-19 ENCOUNTER — Ambulatory Visit (INDEPENDENT_AMBULATORY_CARE_PROVIDER_SITE_OTHER): Payer: BLUE CROSS/BLUE SHIELD | Admitting: Psychology

## 2017-06-19 DIAGNOSIS — F411 Generalized anxiety disorder: Secondary | ICD-10-CM

## 2017-06-25 DIAGNOSIS — G4733 Obstructive sleep apnea (adult) (pediatric): Secondary | ICD-10-CM | POA: Diagnosis not present

## 2017-06-29 DIAGNOSIS — G4733 Obstructive sleep apnea (adult) (pediatric): Secondary | ICD-10-CM | POA: Diagnosis not present

## 2017-07-02 ENCOUNTER — Ambulatory Visit: Payer: BLUE CROSS/BLUE SHIELD | Admitting: Psychology

## 2017-07-21 DIAGNOSIS — H35033 Hypertensive retinopathy, bilateral: Secondary | ICD-10-CM | POA: Diagnosis not present

## 2017-07-30 DIAGNOSIS — G4733 Obstructive sleep apnea (adult) (pediatric): Secondary | ICD-10-CM | POA: Diagnosis not present

## 2017-07-31 ENCOUNTER — Ambulatory Visit (INDEPENDENT_AMBULATORY_CARE_PROVIDER_SITE_OTHER): Payer: BLUE CROSS/BLUE SHIELD | Admitting: Psychology

## 2017-07-31 DIAGNOSIS — F411 Generalized anxiety disorder: Secondary | ICD-10-CM

## 2017-08-14 ENCOUNTER — Telehealth: Payer: Self-pay | Admitting: Family Medicine

## 2017-08-14 NOTE — Telephone Encounter (Signed)
Paperwork: Physician result form for work  [employer] received by [employer] requesting form]:  [place]   Individual made aware of 3-5 business day turn around (Y/N): Y  Office form(s) completed and placed with paperwork (Y/N): Y  Form location:  Dr. Ansel Bong pick up folder in front office.

## 2017-08-20 NOTE — Telephone Encounter (Signed)
noted 

## 2017-08-28 ENCOUNTER — Ambulatory Visit: Payer: Self-pay | Admitting: Psychology

## 2017-08-29 DIAGNOSIS — G4733 Obstructive sleep apnea (adult) (pediatric): Secondary | ICD-10-CM | POA: Diagnosis not present

## 2017-10-09 ENCOUNTER — Other Ambulatory Visit: Payer: Self-pay | Admitting: Family Medicine

## 2017-10-10 NOTE — Telephone Encounter (Signed)
Need more information- he was not on this at last visit. Why does he want to restart this?

## 2017-10-13 ENCOUNTER — Telehealth: Payer: Self-pay | Admitting: Family Medicine

## 2017-10-13 NOTE — Telephone Encounter (Signed)
Copied from [place] [phone]. Topic: Quick Communication - See Telephone Encounter >> Oct 13, 2017  2:09 PM Arletha Grippe wrote: CRM for notification. See Telephone encounter for:   10/13/17.pt needs rx refill sent in for wellbutrin. He needs [facility] [address]  Cb number is [phone]

## 2017-10-14 NOTE — Telephone Encounter (Signed)
Called patient and left a voicemail message asking for a return phone call.

## 2017-10-14 NOTE — Telephone Encounter (Signed)
Per notes Doxepin changed back to Wellbutrin on 06/15/17. No active prescription for Wellbutrin.

## 2017-10-15 NOTE — Telephone Encounter (Signed)
May refill prior wellbutrin. I would advise follow up as he was not doing the best with anxiety last visit.

## 2017-10-16 ENCOUNTER — Ambulatory Visit (INDEPENDENT_AMBULATORY_CARE_PROVIDER_SITE_OTHER): Payer: BLUE CROSS/BLUE SHIELD | Admitting: Family Medicine

## 2017-10-16 ENCOUNTER — Encounter: Payer: Self-pay | Admitting: Family Medicine

## 2017-10-16 VITALS — BP 136/88 | HR 93 | Temp 98.2°F | Ht 70.0 in | Wt 195.2 lb

## 2017-10-16 DIAGNOSIS — I10 Essential (primary) hypertension: Secondary | ICD-10-CM | POA: Diagnosis not present

## 2017-10-16 DIAGNOSIS — F411 Generalized anxiety disorder: Secondary | ICD-10-CM | POA: Diagnosis not present

## 2017-10-16 MED ORDER — CLONAZEPAM 0.5 MG PO TABS: 0.5000 mg | ORAL_TABLET | Freq: Every day | ORAL | 1 refills | Status: DC | PRN

## 2017-10-16 MED ORDER — BUPROPION HCL ER (XL) 150 MG PO TB24: 150.0000 mg | ORAL_TABLET | Freq: Every day | ORAL | 3 refills | Status: DC

## 2017-10-16 MED ORDER — LISINOPRIL-HYDROCHLOROTHIAZIDE 20-12.5 MG PO TABS: 1.0000 | ORAL_TABLET | Freq: Every day | ORAL | 3 refills | Status: DC

## 2017-10-16 NOTE — Assessment & Plan Note (Signed)
S: controlled on lisinopril hct 20-12.71m.  BP Readings from Last 3 Encounters:  10/16/17 136/88  05/15/17 118/84  03/27/17 122/72  A/P: We discussed blood pressure goal of <140/90- towards upper end of goal today. Continue current meds:  Refilled for 1 year

## 2017-10-16 NOTE — Progress Notes (Signed)
Subjective:  Shane Matthews is a 40 y.o. year old very pleasant male patient who presents for/with See problem oriented charting ROS- admits to some anxiety and agitation at times. Difficulty sleeping even with medicine. No SI.    Past Medical History-  Patient Active Problem List   Diagnosis Date Noted  . Current smoker 11/13/2016    Priority: High  . Hyperlipidemia 10/24/2014    Priority: Medium  . Generalized anxiety disorder 10/24/2014    Priority: Medium  . Essential hypertension 10/24/2014    Priority: Medium  . Snoring 11/13/2016    Priority: Low  . OSA (obstructive sleep apnea) 03/27/2017  . Hypersomnia 01/03/2017    Medications- reviewed and updated Current Outpatient Medications  Medication Sig Dispense Refill  . buPROPion (WELLBUTRIN XL) 150 MG 24 hr tablet Take 1 tablet (150 mg total) by mouth daily. 90 tablet 3  . clonazePAM (KLONOPIN) 0.5 MG tablet Take 1 tablet (0.5 mg total) by mouth daily as needed for anxiety. 90 tablet 1  . lisinopril-hydrochlorothiazide (PRINZIDE,ZESTORETIC) 20-12.5 MG tablet Take 1 tablet by mouth daily. 90 tablet 3   No current facility-administered medications for this visit.     Objective: BP 136/88 (BP Location: Left Arm, Patient Position: Sitting, Cuff Size: Large)   Pulse 93   Temp 98.2 F (36.8 C) (Oral)   Ht 5' 10"  (1.778 m)   Wt 195 lb 3.2 oz (88.5 kg)   SpO2 97%   BMI 28.01 kg/m  Gen: NAD, resting comfortably CV: RRR no murmurs rubs or gallops Lungs: CTAB no crackles, wheeze, rhonchi Ext: no edema Skin: warm, dry  Assessment/Plan:  Essential hypertension S: controlled on lisinopril hct 20-12.74m.  BP Readings from Last 3 Encounters:  10/16/17 136/88  05/15/17 118/84  03/27/17 122/72  A/P: We discussed blood pressure goal of <140/90- towards upper end of goal today. Continue current meds:  Refilled for 1 year   Generalized anxiety disorder S: Patient states he has remained on wellbutrin 1516mXR. Not clear why  this was removed from med list. GD7 remains at 12 even with this and klonopin 0.9m37mlmost daily. States he went to 5-6 counseling sessions with LisTrey Paulad was told that he was released (per patient) due to improvement. Denies depressed mood PHq9 of 8 with 3 due to sleep issues, 2 due to energy from poor sleep), occasional anhedonia though mild. No SI- doubt depression  For insomnia portion- tried doxepin but every morning would wake up unrefreshed. Remeron he is concerned about particularly with recent weight gain. Dr. De Corrie Dandy pulmonary had advised to consider both.   With klonopin he feels he gets a good nights rest at least 50% of nights if not more. Without medication he wakes up many more times each night and wakes up unrefreshed each AM.  A/P: GAD7 score above what I would like at 12. With that being said he does not want to increase wellbutrin with sleep issues, does not want to go back to counseling, did not tolerate doxepin for sleep. He may continue wellbutrin and prn klonopin and follow up for new or worsening symptoms.  - continue wellbutrin 150m71m and klonopin 0.9mg 64mt days of week (prefer 5-6 max)   Advised 3-4 week follow up  Meds ordered this encounter  Medications  . buPROPion (WELLBUTRIN XL) 150 MG 24 hr tablet    Sig: Take 1 tablet (150 mg total) by mouth daily.    Dispense:  90 tablet    Refill:  3  . clonazePAM (KLONOPIN) 0.5 MG tablet    Sig: Take 1 tablet (0.5 mg total) by mouth daily as needed for anxiety.    Dispense:  90 tablet    Refill:  1  . lisinopril-hydrochlorothiazide (PRINZIDE,ZESTORETIC) 20-12.5 MG tablet    Sig: Take 1 tablet by mouth daily.    Dispense:  90 tablet    Refill:  3    Return precautions advised.  Garret Reddish, MD

## 2017-10-16 NOTE — Telephone Encounter (Signed)
Medication was refilled and patient is scheduled for an OV today

## 2017-10-16 NOTE — Assessment & Plan Note (Addendum)
S: Patient states he has remained on wellbutrin 158m XR. Not clear why this was removed from med list. GD7 remains at 12 even with this and klonopin 0.522malmost daily. States he went to 5-6 counseling sessions with LiTrey Pauland was told that he was released (per patient) due to improvement. Denies depressed mood PHq9 of 8 with 3 due to sleep issues, 2 due to energy from poor sleep), occasional anhedonia though mild. No SI- doubt depression  For insomnia portion- tried doxepin but every morning would wake up unrefreshed. Remeron he is concerned about particularly with recent weight gain. Dr. DeCorrie Dandyf pulmonary had advised to consider both.   With klonopin he feels he gets a good nights rest at least 50% of nights if not more. Without medication he wakes up many more times each night and wakes up unrefreshed each AM.  A/P: GAD7 score above what I would like at 12. With that being said he does not want to increase wellbutrin with sleep issues, does not want to go back to counseling, did not tolerate doxepin for sleep. He may continue wellbutrin and prn klonopin and follow up for new or worsening symptoms.  - continue wellbutrin 15057mR and klonopin 0.5mg56mst days of week (prefer 5-6 max)

## 2017-10-16 NOTE — Patient Instructions (Signed)
No changes today  Blood pressure looks ok.   Wt Readings from Last 3 Encounters:  10/16/17 195 lb 3.2 oz (88.5 kg)  05/15/17 179 lb 9.6 oz (81.5 kg)  03/27/17 184 lb 9.6 oz (83.7 kg)  do need to watch out on the weight trend because can make blood pressure not well controlled if continues to go up.

## 2017-11-04 DIAGNOSIS — G4733 Obstructive sleep apnea (adult) (pediatric): Secondary | ICD-10-CM | POA: Diagnosis not present

## 2018-03-15 ENCOUNTER — Encounter: Payer: Self-pay | Admitting: Family Medicine

## 2018-03-17 ENCOUNTER — Other Ambulatory Visit: Payer: Self-pay

## 2018-03-17 DIAGNOSIS — Z1283 Encounter for screening for malignant neoplasm of skin: Secondary | ICD-10-CM

## 2018-06-02 ENCOUNTER — Encounter: Payer: Self-pay | Admitting: Family Medicine

## 2018-06-03 ENCOUNTER — Other Ambulatory Visit: Payer: Self-pay

## 2018-06-03 MED ORDER — LISINOPRIL-HYDROCHLOROTHIAZIDE 20-12.5 MG PO TABS: 1.0000 | ORAL_TABLET | Freq: Every day | ORAL | 0 refills | Status: DC

## 2018-06-03 MED ORDER — CLONAZEPAM 0.5 MG PO TABS: 0.5000 mg | ORAL_TABLET | Freq: Every day | ORAL | 0 refills | Status: DC | PRN

## 2018-06-08 DIAGNOSIS — L570 Actinic keratosis: Secondary | ICD-10-CM | POA: Diagnosis not present

## 2018-06-08 DIAGNOSIS — D229 Melanocytic nevi, unspecified: Secondary | ICD-10-CM | POA: Diagnosis not present

## 2018-06-08 DIAGNOSIS — L309 Dermatitis, unspecified: Secondary | ICD-10-CM | POA: Diagnosis not present

## 2018-07-07 ENCOUNTER — Ambulatory Visit (INDEPENDENT_AMBULATORY_CARE_PROVIDER_SITE_OTHER): Payer: BLUE CROSS/BLUE SHIELD | Admitting: Family Medicine

## 2018-07-07 ENCOUNTER — Encounter: Payer: Self-pay | Admitting: Family Medicine

## 2018-07-07 VITALS — BP 124/88 | HR 84 | Temp 98.5°F | Ht 70.0 in | Wt 198.8 lb

## 2018-07-07 DIAGNOSIS — E785 Hyperlipidemia, unspecified: Secondary | ICD-10-CM | POA: Diagnosis not present

## 2018-07-07 DIAGNOSIS — Z0000 Encounter for general adult medical examination without abnormal findings: Secondary | ICD-10-CM

## 2018-07-07 LAB — LIPID PANEL
CHOL/HDL RATIO: 5
Cholesterol: 207 mg/dL — ABNORMAL HIGH (ref 0–200)
HDL: 39.5 mg/dL (ref >39.00)
NONHDL: 167.53
Triglycerides: 364 mg/dL — ABNORMAL HIGH (ref 0.0–149.0)
VLDL: 72.8 mg/dL — ABNORMAL HIGH (ref 0.0–40.0)

## 2018-07-07 LAB — COMPREHENSIVE METABOLIC PANEL
ALT: 43 U/L (ref 0–53)
AST: 31 U/L (ref 0–37)
Albumin: 4.7 g/dL (ref 3.5–5.2)
Alkaline Phosphatase: 67 U/L (ref 39–117)
BILIRUBIN TOTAL: 0.6 mg/dL (ref 0.2–1.2)
BUN: 12 mg/dL (ref 6–23)
CHLORIDE: 100 meq/L (ref 96–112)
CO2: 27 meq/L (ref 19–32)
Calcium: 9.8 mg/dL (ref 8.4–10.5)
Creatinine, Ser: 1.08 mg/dL (ref 0.40–1.50)
GFR: 78.78 mL/min (ref >60.00)
GLUCOSE: 92 mg/dL (ref 70–99)
Potassium: 4.3 mEq/L (ref 3.5–5.1)
Sodium: 137 mEq/L (ref 135–145)
Total Protein: 7.3 g/dL (ref 6.0–8.3)

## 2018-07-07 LAB — POC URINALSYSI DIPSTICK (AUTOMATED)
Bilirubin, UA: NEGATIVE
Glucose, UA: NEGATIVE
KETONES UA: NEGATIVE
LEUKOCYTES UA: NEGATIVE
Nitrite, UA: NEGATIVE
PH UA: 6 (ref 5.0–8.0)
PROTEIN UA: NEGATIVE
RBC UA: NEGATIVE
Spec Grav, UA: 1.01 (ref 1.010–1.025)
Urobilinogen, UA: 0.2 E.U./dL

## 2018-07-07 LAB — LDL CHOLESTEROL, DIRECT: Direct LDL: 111 mg/dL

## 2018-07-07 LAB — CBC
HCT: 43.3 % (ref 39.0–52.0)
HEMOGLOBIN: 15 g/dL (ref 13.0–17.0)
MCHC: 34.5 g/dL (ref 30.0–36.0)
MCV: 91.5 fl (ref 78.0–100.0)
Platelets: 294 10*3/uL (ref 150.0–400.0)
RBC: 4.73 Mil/uL (ref 4.22–5.81)
RDW: 13 % (ref 11.5–15.5)
WBC: 8.8 10*3/uL (ref 4.0–10.5)

## 2018-07-07 MED ORDER — LISINOPRIL-HYDROCHLOROTHIAZIDE 20-12.5 MG PO TABS: 1.0000 | ORAL_TABLET | Freq: Every day | ORAL | 3 refills | Status: DC

## 2018-07-07 MED ORDER — CLONAZEPAM 0.5 MG PO TABS: 0.5000 mg | ORAL_TABLET | Freq: Every day | ORAL | 5 refills | Status: DC | PRN

## 2018-07-07 NOTE — Progress Notes (Signed)
Phone: [phone]  Subjective:  Patient presents today for their annual physical. Chief complaint-noted.   See problem oriented charting- ROS- full  review of systems was completed and negative except for: anxiety but reasonably controlled on klonopin   The following were reviewed and entered/updated in epic: Past Medical History:  Diagnosis Date  . Anxiety   . Chicken pox   . GERD (gastroesophageal reflux disease)    sporadic  . HTN (hypertension)   . Insomnia    Patient Active Problem List   Diagnosis Date Noted  . Current smoker 11/13/2016    Priority: High  . Hyperlipidemia 10/24/2014    Priority: Medium  . Generalized anxiety disorder 10/24/2014    Priority: Medium  . Essential hypertension 10/24/2014    Priority: Medium  . Snoring 11/13/2016    Priority: Low  . OSA (obstructive sleep apnea) 03/27/2017  . Hypersomnia 01/03/2017   Past Surgical History:  Procedure Laterality Date  . WISDOM TOOTH EXTRACTION      Family History  Problem Relation Age of Onset  . Stroke Mother        82 at death  . Diabetes Mother   . Hypertension Mother   . Stroke Father        24 at death  . Lung cancer Father        long term smoker  . Skin cancer Father   . Healthy Sister        40 years older than him  . Healthy Brother     Medications- reviewed and updated Current Outpatient Medications  Medication Sig Dispense Refill  . clonazePAM (KLONOPIN) 0.5 MG tablet Take 1 tablet (0.5 mg total) by mouth daily as needed for anxiety. 30 tablet 5  . lisinopril-hydrochlorothiazide (PRINZIDE,ZESTORETIC) 20-12.5 MG tablet Take 1 tablet by mouth daily. 90 tablet 3   No current facility-administered medications for this visit.     Allergies-reviewed and updated No Known Allergies  Social History   Social History Narrative   Divorced. With fiancee since 3358. 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    HS degree      Hobbies: golf, fish, travel-beach, [place], enjoys all sports particular hockey and [employer]    Objective: BP 124/88 (BP Location: Left Arm, Patient Position: Sitting, Cuff Size: Large)   Pulse 84   Temp 98.5 F (36.9 C) (Oral)   Ht 5' 10"  (1.778 m)   Wt 198 lb 12.8 oz (90.2 kg)   SpO2 97%   BMI 28.52 kg/m  Gen: NAD, resting comfortably HEENT: Mucous membranes are moist. Oropharynx normal Neck: no thyromegaly CV: RRR no murmurs rubs or gallops Lungs: CTAB no crackles, wheeze, rhonchi Abdomen: soft/nontender/nondistended/normal bowel sounds. No rebound or guarding. overweight Ext: no edema Skin: warm, dry Neuro: grossly normal, moves all extremities, PERRLA   Assessment/Plan:  42 y.o. male presenting for annual physical.  Health Maintenance counseling: 1. Anticipatory guidance: Patient counseled regarding regular dental exams -q6 months, eye exams -yearly, wearing seatbelts.  2. Risk factor reduction:  Advised patient of need for regular exercise and diet rich and fruits and vegetables to reduce risk of heart attack and stroke. Exercise- not exercising right now- big weight gain. Diet-weight up 20 lbs in last year- thinks mainly related to diet. He eats out a lot and that doesn't help.  Wt Readings from Last 3 Encounters:  07/07/18 198 lb 12.8 oz (  90.2 kg)  10/16/17 195 lb 3.2 oz (88.5 kg)  05/15/17 179 lb 9.6 oz (81.5 kg)  3. Immunizations/screenings/ancillary studies- flu shot with work in a month Immunization History  Administered Date(s) Administered  . Influenza,trivalent, recombinat, inj, PF 08/29/2009, 09/03/2010, 09/06/2012  . Influenza-Unspecified 09/03/2014, 08/04/2015, 09/17/2016, 08/21/2017  . Tdap 11/03/2008, 08/29/2009  4. Prostate cancer screening- no family history, start at age 89  5. Colon cancer screening - no family history, start at age 61 6. Skin cancer screening- dermatology a few weeks ago (clean report).  advised regular sunscreen use. Denies worrisome, changing, or new skin lesions.   Status of chronic or acute concerns   HTN- controlled on lisinopril-hctz 20-12.68m   smoker- 1/4 ppd on wellbutrin last year. Work days hard for him. We stopped wellbutrin and trialed chantix last year- didn't help. He is up to 1/2 PPD. He has set a quit date before his cruise within next few weeks.   GAD- multiple SSRI, SNRI, wellbutrin failures. Prn klonopin has been most helpful to him- agrees to 5-6 days per week maximum- sometimes uses at night to help him sleep. We also were to trial doxepin last year- trial 111m- didn't work  HLD- update lipids. ascvd risk last check was still under 5%- we discussed how quitting smoking could further help him.   6 month BP and weight check Future Appointments  Date Time Provider DeCricket3/03/2019  3:30 PM HuMarin OlpMD [facility]   Lab/Order associations: Preventative health care - Plan: Lipid Profile, CBC, Comprehensive metabolic panel, POCT Urinalysis Dipstick (Automated), Lipid Profile  Hyperlipidemia, unspecified hyperlipidemia type - Plan: Lipid Profile, CBC, Comprehensive metabolic panel, POCT Urinalysis Dipstick (Automated), Lipid Profile  Meds ordered this encounter  Medications  . lisinopril-hydrochlorothiazide (PRINZIDE,ZESTORETIC) 20-12.5 MG tablet    Sig: Take 1 tablet by mouth daily.    Dispense:  90 tablet    Refill:  3  . clonazePAM (KLONOPIN) 0.5 MG tablet    Sig: Take 1 tablet (0.5 mg total) by mouth daily as needed for anxiety.    Dispense:  30 tablet    Refill:  5   Return precautions advised.  StGarret ReddishMD

## 2018-07-07 NOTE — Addendum Note (Signed)
Addended by: Frutoso Chase A on: 07/07/2018 10:56 AM   Modules accepted: Orders

## 2018-07-07 NOTE — Patient Instructions (Addendum)
Health Maintenance Due  Topic Date Due  . INFLUENZA VACCINE -will get at work in a month- please send [place] the date you get the vaccine so we can update records 06/03/2018   You set a goal of eating out just twice a week and trying to cook/make healthier foods at home. If you feel like you are struggling with making progress- perhaps trial weight watchers online or just start with a calorie counting app. I also like your idea of increasing exercise- the general goal is 150 minutes a week.   I love your goal of quitting smoking- you can do this- wont be easy but I know you can   Please stop by lab before you go

## 2018-07-27 ENCOUNTER — Encounter: Payer: Self-pay | Admitting: Family Medicine

## 2019-01-06 ENCOUNTER — Ambulatory Visit: Payer: BLUE CROSS/BLUE SHIELD | Admitting: Family Medicine

## 2019-01-13 ENCOUNTER — Encounter: Payer: Self-pay | Admitting: Family Medicine

## 2019-01-13 MED ORDER — CLONAZEPAM 0.5 MG PO TABS: 0.5000 mg | ORAL_TABLET | Freq: Every day | ORAL | 5 refills | Status: DC | PRN

## 2019-01-13 NOTE — Telephone Encounter (Signed)
Dr. Yong Channel,  Okay to send in Rx?   Please see message.

## 2019-01-28 ENCOUNTER — Ambulatory Visit: Payer: BLUE CROSS/BLUE SHIELD | Admitting: Family Medicine

## 2019-03-08 DIAGNOSIS — G4733 Obstructive sleep apnea (adult) (pediatric): Secondary | ICD-10-CM | POA: Diagnosis not present

## 2019-07-06 DIAGNOSIS — G4733 Obstructive sleep apnea (adult) (pediatric): Secondary | ICD-10-CM | POA: Diagnosis not present

## 2019-07-11 ENCOUNTER — Other Ambulatory Visit: Payer: Self-pay | Admitting: Family Medicine

## 2019-07-12 ENCOUNTER — Encounter: Payer: Self-pay | Admitting: Family Medicine

## 2019-07-12 MED ORDER — CLONAZEPAM 0.5 MG PO TABS: 0.5000 mg | ORAL_TABLET | Freq: Every day | ORAL | 0 refills | Status: DC | PRN

## 2019-07-12 NOTE — Telephone Encounter (Signed)
Patient need to schedule an ov for more refills.

## 2019-07-12 NOTE — Telephone Encounter (Signed)
Last OV:  07/07/2018 Next OV:  08/17/2019 Last fill:  06/14/2019  Please advise.  Patient states his appointment was rescheduled.

## 2019-07-14 ENCOUNTER — Other Ambulatory Visit: Payer: Self-pay | Admitting: Family Medicine

## 2019-07-14 MED ORDER — LISINOPRIL-HYDROCHLOROTHIAZIDE 20-12.5 MG PO TABS: 1.0000 | ORAL_TABLET | Freq: Every day | ORAL | 0 refills | Status: DC

## 2019-08-05 ENCOUNTER — Other Ambulatory Visit: Payer: Self-pay | Admitting: Family Medicine

## 2019-08-10 ENCOUNTER — Other Ambulatory Visit: Payer: Self-pay | Admitting: Family Medicine

## 2019-08-17 ENCOUNTER — Encounter: Payer: BLUE CROSS/BLUE SHIELD | Admitting: Family Medicine

## 2019-08-25 NOTE — Progress Notes (Signed)
Phone: [phone]    Subjective:  Patient presents today for their annual physical. Chief complaint-noted.   See problem oriented charting- Review of Systems  Constitutional: Negative.   HENT: Negative.   Eyes: Negative.   Respiratory: Negative.   Cardiovascular: Negative.   Gastrointestinal: Negative.   Genitourinary: Negative.   Musculoskeletal: Negative.   Skin: Negative.   Neurological: Negative.   Endo/Heme/Allergies: Negative.   Psychiatric/Behavioral: The patient is nervous/anxious.        Medication helps with symptoms.    The following were reviewed and entered/updated in epic: Past Medical History:  Diagnosis Date  . Anxiety   . Chicken pox   . GERD (gastroesophageal reflux disease)    sporadic  . HTN (hypertension)   . Insomnia    Patient Active Problem List   Diagnosis Date Noted  . Current smoker 11/13/2016    Priority: High  . OSA (obstructive sleep apnea) 03/27/2017    Priority: Medium  . Hyperlipidemia 10/24/2014    Priority: Medium  . Generalized anxiety disorder 10/24/2014    Priority: Medium  . Essential hypertension 10/24/2014    Priority: Medium   Past Surgical History:  Procedure Laterality Date  . WISDOM TOOTH EXTRACTION      Family History  Problem Relation Age of Onset  . Stroke Mother        65 at death  . Diabetes Mother   . Hypertension Mother   . Stroke Father        41 at death  . Lung cancer Father        long term smoker  . Skin cancer Father   . Healthy Sister        26 years older than him  . Healthy Brother     Medications- reviewed and updated Current Outpatient Medications  Medication Sig Dispense Refill  . clonazePAM (KLONOPIN) 0.5 MG tablet TAKE 1 TABLET BY MOUTH EVERY DAY AS NEEDED FOR ANXIETY 30 tablet 5  . lisinopril-hydrochlorothiazide (ZESTORETIC) 20-12.5 MG tablet Take 1 tablet by mouth daily. 90 tablet 3  . Multiple Vitamin (MULTIVITAMIN) tablet Take 1 tablet by mouth daily.    . Omega-3 Fatty  Acids (FISH OIL) 1000 MG CAPS Take by mouth.     No current facility-administered medications for this visit.     Allergies-reviewed and updated No Known Allergies  Social History   Social History Narrative   Divorced. With fiancee since 20110. 78 son 95 years old 08/2019 from prior marriage (50/50)      Works with [employer]. Wife with [employer]. 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    HS degree      Hobbies: golf, fish, travel-beach, [place], enjoys all sports particular hockey and [employer]     Objective:  BP 126/84   Pulse 84   Temp 98.1 F (36.7 C) (Temporal)   Ht 5' 10"  (1.778 m)   Wt 196 lb 6.4 oz (89.1 kg)   SpO2 97%   BMI 28.18 kg/m  Gen: NAD, resting comfortably HEENT: Mask not removed due to covid 19. TM normal. Bridge of nose normal. Eyelids normal.  Neck: no thyromegaly or cervical lymphadenopathy  CV: RRR no murmurs rubs or gallops Lungs: CTAB no crackles, wheeze, rhonchi Abdomen: soft/nontender/nondistended/normal bowel sounds. No rebound or guarding.  Ext: no edema Skin: warm, dry Neuro: grossly normal, moves all extremities, PERRLA    Assessment and Plan:  43 y.o. male presenting for annual physical.  Health Maintenance counseling: 1. Anticipatory  guidance: Patient counseled regarding regular dental exams every q6 months, eye exams yearly,  avoiding smoking and second hand smoke current smoker down to 1/2 ppd  , limiting alcohol to 2 beverages per day.   2. Risk factor reduction:  Advised patient of need for regular exercise and diet rich and fruits and vegetables to reduce risk of heart attack and stroke. Exercise- working on increasing activity. Meeting step goal daily of 7k-8k steps- walking with her fiancee . Diet- working on decreasing red meat and increasing vegetables.  Down to 2 lbs from last year.  Wt Readings from Last 3 Encounters:  08/30/19 196 lb 6.4 oz (89.1 kg)  07/07/18 198 lb 12.8 oz (90.2 kg)   10/16/17 195 lb 3.2 oz (88.5 kg)  3. Immunizations/screenings/ancillary studies-flu shot today.  Declines Tdap until next visit  Immunization History  Administered Date(s) Administered  . Influenza,trivalent, recombinat, inj, PF 08/29/2009, 09/03/2010, 09/06/2012  . Influenza-Unspecified 09/03/2014, 08/04/2015, 09/17/2016, 08/21/2017, 07/27/2018  . Tdap 11/03/2008, 08/29/2009  4. Prostate cancer screening-never- no family history, start at age 45   5. Colon cancer screening - never.  Discussed new ACS guidelines starting at age 29-he would liketo explore cologuard if covered 6. Skin cancer screening/prevention- seen by dermatology a year ago. advised regular sunscreen use. Denies worrisome, changing, or new skin lesions.  7. Testicular cancer screening- advised monthly self exams done every month-no concerns at this time.  8. STD screening- patient opts does not need at this time no symptoms or concerns.  He is monogamous with his fiance  41. Current smoker  smoker- 1/2 pack per day given [place] Quit line information.  Discussed AAA screen at 40.  Discussed potential lung cancer screening starting at 7 or younger if they change the guidelines - he may not qualify due to lower smoking volume- under 20 pack years it sounds like. Strongly encouraged cessation.   Status of chronic or acute concerns  GAD- doing ok has had some increased stress this year with all changes. Is taking Clonazepam nightly to help with sleep- feels fine next AM. Has had few days that he has had to take during the day. On average 1-2 times a month in daytime.   Current smoker-has successfully cut back to half pack a day from 1 ppd though lowest ove rlast few years 1/4 PPD -congratulated him on efforts - wellbutrin was stopped and we tried chantix instead. Could retrial in future if needed- he wants to stay stable on current regimen for now - did not tolerate chantix for smoking cessation  OSA-compliant with CPAP   HTN-  controlled on Zestoretic 20-12.33m. good control at home.  Review: taking medications as instructed, no medication side effects noted, no TIAs, no chest pain on exertion, no dyspnea on exertion, no swelling of ankles. Smoker: No.   Recommended follow up: 64-monthollow-up- clonazepam refill and BP recheck  Lab/Order associations: fasting   ICD-10-CM   1. Preventative health care  Z00.00 CBC with Differential/Platelet    Comprehensive metabolic panel    Lipid panel    POCT Urinalysis Dipstick (Automated)  2. Hyperlipidemia, unspecified hyperlipidemia type  E78.5 CBC with Differential/Platelet    Comprehensive metabolic panel    Lipid panel    POCT Urinalysis Dipstick (Automated)  3. Essential hypertension  I10   4. Current smoker  F17.200   5. OSA (obstructive sleep apnea)  G47.33   flu shot being given.   Meds ordered this encounter  Medications  . clonazePAM (KLONOPIN) 0.5  MG tablet    Sig: TAKE 1 TABLET BY MOUTH EVERY DAY AS NEEDED FOR ANXIETY    Dispense:  30 tablet    Refill:  5    Not to exceed 5 additional fills before 01/08/2020  . lisinopril-hydrochlorothiazide (ZESTORETIC) 20-12.5 MG tablet    Sig: Take 1 tablet by mouth daily.    Dispense:  90 tablet    Refill:  3   Return precautions advised.  Garret Reddish, MD

## 2019-08-25 NOTE — Patient Instructions (Addendum)
Health Maintenance Due  Topic Date Due  . INFLUENZA VACCINE- today   Will need TDAP at next visit  06/04/2019   Cologuard - Shane Matthews please set this up and give him a handout with # to check on if insurance will cover- if they will not- we will plan on colonoscopy or cologuard at 43.   Great job losing 2 lbs despite covid! Keep up the great effort and lets set a goal of 5 lbs by next visit.   Love your effort to quit smoking- lets keep working our way down- happy to help with medicine if needed. Could retry wellbutrin if you want

## 2019-08-30 ENCOUNTER — Encounter: Payer: Self-pay | Admitting: Family Medicine

## 2019-08-30 ENCOUNTER — Other Ambulatory Visit: Payer: Self-pay

## 2019-08-30 ENCOUNTER — Ambulatory Visit (INDEPENDENT_AMBULATORY_CARE_PROVIDER_SITE_OTHER): Payer: BC Managed Care – PPO | Admitting: Family Medicine

## 2019-08-30 ENCOUNTER — Other Ambulatory Visit: Payer: Self-pay | Admitting: Family Medicine

## 2019-08-30 VITALS — BP 126/84 | HR 84 | Temp 98.1°F | Ht 70.0 in | Wt 196.4 lb

## 2019-08-30 DIAGNOSIS — E785 Hyperlipidemia, unspecified: Secondary | ICD-10-CM

## 2019-08-30 DIAGNOSIS — Z0000 Encounter for general adult medical examination without abnormal findings: Secondary | ICD-10-CM | POA: Diagnosis not present

## 2019-08-30 DIAGNOSIS — G4733 Obstructive sleep apnea (adult) (pediatric): Secondary | ICD-10-CM

## 2019-08-30 DIAGNOSIS — I10 Essential (primary) hypertension: Secondary | ICD-10-CM

## 2019-08-30 DIAGNOSIS — Z23 Encounter for immunization: Secondary | ICD-10-CM | POA: Diagnosis not present

## 2019-08-30 DIAGNOSIS — F17200 Nicotine dependence, unspecified, uncomplicated: Secondary | ICD-10-CM | POA: Diagnosis not present

## 2019-08-30 LAB — POC URINALSYSI DIPSTICK (AUTOMATED)
Bilirubin, UA: NEGATIVE
Blood, UA: NEGATIVE
Glucose, UA: NEGATIVE
Ketones, UA: NEGATIVE
Leukocytes, UA: NEGATIVE
Nitrite, UA: NEGATIVE
Protein, UA: NEGATIVE
Spec Grav, UA: 1.01 (ref 1.010–1.025)
Urobilinogen, UA: 0.2 E.U./dL
pH, UA: 6.5 (ref 5.0–8.0)

## 2019-08-30 LAB — CBC WITH DIFFERENTIAL/PLATELET
Basophils Absolute: 0.1 10*3/uL (ref 0.0–0.1)
Basophils Relative: 0.7 % (ref 0.0–3.0)
Eosinophils Absolute: 0.3 10*3/uL (ref 0.0–0.7)
Eosinophils Relative: 3.6 % (ref 0.0–5.0)
HCT: 46.1 % (ref 39.0–52.0)
Hemoglobin: 15.6 g/dL (ref 13.0–17.0)
Lymphocytes Relative: 34 % (ref 12.0–46.0)
Lymphs Abs: 3 10*3/uL (ref 0.7–4.0)
MCHC: 33.9 g/dL (ref 30.0–36.0)
MCV: 94.6 fl (ref 78.0–100.0)
Monocytes Absolute: 0.9 10*3/uL (ref 0.1–1.0)
Monocytes Relative: 10.1 % (ref 3.0–12.0)
Neutro Abs: 4.6 10*3/uL (ref 1.4–7.7)
Neutrophils Relative %: 51.6 % (ref 43.0–77.0)
Platelets: 287 10*3/uL (ref 150.0–400.0)
RBC: 4.87 Mil/uL (ref 4.22–5.81)
RDW: 12.7 % (ref 11.5–15.5)
WBC: 8.9 10*3/uL (ref 4.0–10.5)

## 2019-08-30 LAB — LIPID PANEL
Cholesterol: 233 mg/dL — ABNORMAL HIGH (ref 0–200)
HDL: 41.4 mg/dL (ref >39.00)
Total CHOL/HDL Ratio: 6
Triglycerides: 445 mg/dL — ABNORMAL HIGH (ref 0.0–149.0)

## 2019-08-30 LAB — COMPREHENSIVE METABOLIC PANEL
ALT: 43 U/L (ref 0–53)
AST: 29 U/L (ref 0–37)
Albumin: 5 g/dL (ref 3.5–5.2)
Alkaline Phosphatase: 78 U/L (ref 39–117)
BUN: 11 mg/dL (ref 6–23)
CO2: 30 mEq/L (ref 19–32)
Calcium: 9.9 mg/dL (ref 8.4–10.5)
Chloride: 100 mEq/L (ref 96–112)
Creatinine, Ser: 0.93 mg/dL (ref 0.40–1.50)
GFR: 87.63 mL/min (ref >60.00)
Glucose, Bld: 98 mg/dL (ref 70–99)
Potassium: 4.1 mEq/L (ref 3.5–5.1)
Sodium: 138 mEq/L (ref 135–145)
Total Bilirubin: 0.6 mg/dL (ref 0.2–1.2)
Total Protein: 7.7 g/dL (ref 6.0–8.3)

## 2019-08-30 LAB — LDL CHOLESTEROL, DIRECT: Direct LDL: 114 mg/dL

## 2019-08-30 MED ORDER — CLONAZEPAM 0.5 MG PO TABS: ORAL_TABLET | ORAL | 5 refills | Status: DC

## 2019-08-30 MED ORDER — LISINOPRIL-HYDROCHLOROTHIAZIDE 20-12.5 MG PO TABS: 1.0000 | ORAL_TABLET | Freq: Every day | ORAL | 3 refills | Status: DC

## 2019-08-30 NOTE — Addendum Note (Signed)
Addended by: Francis Dowse T on: 08/30/2019 11:26 AM   Modules accepted: Orders

## 2019-09-13 ENCOUNTER — Other Ambulatory Visit: Payer: Self-pay

## 2019-09-13 ENCOUNTER — Encounter: Payer: Self-pay | Admitting: Family Medicine

## 2019-09-13 MED ORDER — BUPROPION HCL ER (XL) 150 MG PO TB24: 150.0000 mg | ORAL_TABLET | Freq: Every day | ORAL | 1 refills | Status: DC

## 2020-02-13 ENCOUNTER — Telehealth: Payer: Self-pay

## 2020-02-13 NOTE — Telephone Encounter (Signed)
Done

## 2020-02-14 DIAGNOSIS — Z0000 Encounter for general adult medical examination without abnormal findings: Secondary | ICD-10-CM | POA: Diagnosis not present

## 2020-02-22 LAB — COLOGUARD
COLOGUARD: NEGATIVE
Cologuard: NEGATIVE

## 2020-02-25 ENCOUNTER — Encounter: Payer: Self-pay | Admitting: Family Medicine

## 2020-02-28 ENCOUNTER — Ambulatory Visit (INDEPENDENT_AMBULATORY_CARE_PROVIDER_SITE_OTHER): Payer: BC Managed Care – PPO | Admitting: Family Medicine

## 2020-02-28 ENCOUNTER — Other Ambulatory Visit: Payer: Self-pay

## 2020-02-28 ENCOUNTER — Encounter: Payer: Self-pay | Admitting: Family Medicine

## 2020-02-28 VITALS — BP 118/80 | HR 92 | Temp 97.8°F | Ht 70.0 in | Wt 195.0 lb

## 2020-02-28 DIAGNOSIS — Z23 Encounter for immunization: Secondary | ICD-10-CM | POA: Diagnosis not present

## 2020-02-28 DIAGNOSIS — F411 Generalized anxiety disorder: Secondary | ICD-10-CM | POA: Diagnosis not present

## 2020-02-28 DIAGNOSIS — F17200 Nicotine dependence, unspecified, uncomplicated: Secondary | ICD-10-CM | POA: Diagnosis not present

## 2020-02-28 DIAGNOSIS — E785 Hyperlipidemia, unspecified: Secondary | ICD-10-CM | POA: Diagnosis not present

## 2020-02-28 DIAGNOSIS — I10 Essential (primary) hypertension: Secondary | ICD-10-CM | POA: Diagnosis not present

## 2020-02-28 MED ORDER — CLONAZEPAM 0.5 MG PO TABS: ORAL_TABLET | ORAL | 5 refills | Status: DC

## 2020-02-28 MED ORDER — LISINOPRIL-HYDROCHLOROTHIAZIDE 20-12.5 MG PO TABS: 1.0000 | ORAL_TABLET | Freq: Every day | ORAL | 1 refills | Status: DC

## 2020-02-28 NOTE — Patient Instructions (Addendum)
Health Maintenance Due  Topic Date Due  . COVID-19 Vaccine (1)- holding off on this right now- he would prefer Wynetta Emery and [place] Never done  . TETANUS/TDAP - today before you leave 08/30/2019   No changes today other than cut alcohol to one or less per day. If you find this hard- AA is a good resource. As always would encourage quitting smoking as well.    Recommended follow up: Return in about 6 months (around 08/29/2020) for physical or sooner if needed.

## 2020-02-28 NOTE — Progress Notes (Signed)
Phone [phone] In person visit   Subjective:   Shane Matthews is a 44 y.o. year old very pleasant male patient who presents for/with See problem oriented charting Chief Complaint  Patient presents with  . Hypertension    No new concerns at this time.    This visit occurred during the SARS-CoV-2 public health emergency.  Safety protocols were in place, including screening questions prior to the visit, additional usage of staff PPE, and extensive cleaning of exam room while observing appropriate contact time as indicated for disinfecting solutions.   Past Medical History-  Patient Active Problem List   Diagnosis Date Noted  . Current smoker 11/13/2016    Priority: High  . OSA (obstructive sleep apnea) 03/27/2017    Priority: Medium  . Hyperlipidemia 10/24/2014    Priority: Medium  . Generalized anxiety disorder 10/24/2014    Priority: Medium  . Essential hypertension 10/24/2014    Priority: Medium    Medications- reviewed and updated Current Outpatient Medications  Medication Sig Dispense Refill  . buPROPion (WELLBUTRIN XL) 150 MG 24 hr tablet Take 1 tablet (150 mg total) by mouth daily. 90 tablet 1  . clonazePAM (KLONOPIN) 0.5 MG tablet TAKE 1 TABLET BY MOUTH EVERY DAY AS NEEDED FOR ANXIETY 30 tablet 5  . lisinopril-hydrochlorothiazide (ZESTORETIC) 20-12.5 MG tablet TAKE 1 TABLET BY MOUTH EVERY DAY 90 tablet 1  . lisinopril-hydrochlorothiazide (ZESTORETIC) 20-12.5 MG tablet Take 1 tablet by mouth daily. 90 tablet 3  . Multiple Vitamin (MULTIVITAMIN) tablet Take 1 tablet by mouth daily.    . Omega-3 Fatty Acids (FISH OIL) 1000 MG CAPS Take by mouth.     No current facility-administered medications for this visit.     Objective:  BP 118/80   Pulse 92   Temp 97.8 F (36.6 C) (Temporal)   Ht 5' 10"  (1.778 m)   Wt 195 lb (88.5 kg)   SpO2 97%   BMI 27.98 kg/m  Gen: NAD, resting comfortably CV: RRR no murmurs rubs or gallops Lungs: CTAB no crackles, wheeze,  rhonchi Abdomen: soft/nontender/nondistended/normal bowel sounds. No rebound or guarding.  Ext: no edema     Assessment and Plan   #hypertension S: medication:  Lisinopril hctz 20-12.5 mg Home readings #s: usually 120s/70s-80s about twice a month BP Readings from Last 3 Encounters:  02/28/20 118/80  08/30/19 126/84  07/07/18 124/88  A/P: Stable. Continue current medications.   #hypertriglyceridemia/overweight S: Medication:none.  About 20 alcoholic beverages a week- michelob ultra  Wife is doing weight watchers and he is kind of following along. Doing walking regularly.  Patient has lost 1 pound-he may have gained some weight and then lost Lab Results  Component Value Date   CHOL 233 (H) 08/30/2019   HDL 41.40 08/30/2019   LDLCALC 131 (H) 05/08/2017   LDLDIRECT 114.0 08/30/2019   TRIG (H) 08/30/2019    445.0 Triglyceride is over 400; calculations on Lipids are invalid.   CHOLHDL 6 08/30/2019   A/P: For hypertriglyceridemia-we discussed the importance of healthy eating/regular exercise.  Encouraged limiting alcohol to 1 beverage per day-we discussed technically if it remains over 14/week this would be considered alcohol dependence.  For overweight status- Encouraged need for healthy eating, regular exercise, weight loss.    #Generalized anxiety disorder S: Clonazepam-mainly using for sleep recently. Takes clonazepam before bedtime- doesn't drink for several hours before using. wellbutrin in AM still made him feel wired all day   Walking in the evening helps with anxiety and sleep. Counseling was not super  helpful with our office.  A/P: Reasonable control-continue clonazepam.  Patient knows ideally to limit alcohol to 1 beverage per day and space out from the clonazepam  # smoker- strongly encouraged smoking cessation not ready to quit.    Recommended follow up: Return in about 6 months (around 08/29/2020) for physical or sooner if needed. Future Appointments  Date Time  Provider [place]  09/05/2020  8:00 AM Marin Olp, MD [facility]   Lab/Order associations:   ICD-10-CM   1. Essential hypertension  I10   2. Hyperlipidemia, unspecified hyperlipidemia type  E78.5   3. Generalized anxiety disorder  F41.1   4. Current smoker  F17.200   5. Need for Tdap vaccination  Z23 Tdap vaccine greater than or equal to 7yo IM    Meds ordered this encounter  Medications  . clonazePAM (KLONOPIN) 0.5 MG tablet    Sig: TAKE 1 TABLET BY MOUTH EVERY DAY AS NEEDED FOR ANXIETY    Dispense:  30 tablet    Refill:  5    Not to exceed 5 additional fills before 01/08/2020  . lisinopril-hydrochlorothiazide (ZESTORETIC) 20-12.5 MG tablet    Sig: Take 1 tablet by mouth daily.    Dispense:  90 tablet    Refill:  1   Return precautions advised.  Garret Reddish, MD

## 2020-08-31 NOTE — Patient Instructions (Addendum)
Please stop by lab before you go If you have mychart- we will send your results within 3 business days of [place] receiving them.  If you do not have mychart- we will call you about results within 5 business days of [place] receiving them.  *please note we are currently using [facility] which has a longer processing time than [place] typically so labs may not come back as quickly as in the past *please also note that you will see labs on mychart as soon as they post. I will later go in and write notes on them- will say "notes from Dr. Yong Channel"  Consider cutting phone out and TV an hour before bed. Try reading a book or something more relaxing  Health Maintenance Due  Topic Date Due  . INFLUENZA VACCINE In office flu shot today 06/03/2020

## 2020-08-31 NOTE — Progress Notes (Signed)
Phone: [phone]    Subjective:  Patient presents today for their annual physical. Chief complaint-noted.   See problem oriented charting- ROS- full  review of systems was completed and negative per full ROS sheet  The following were reviewed and entered/updated in epic: Past Medical History:  Diagnosis Date  . Anxiety   . Chicken pox   . GERD (gastroesophageal reflux disease)    sporadic  . HTN (hypertension)   . Insomnia    Patient Active Problem List   Diagnosis Date Noted  . Current smoker 11/13/2016    Priority: High  . OSA (obstructive sleep apnea) 03/27/2017    Priority: Medium  . Hyperlipidemia 10/24/2014    Priority: Medium  . Generalized anxiety disorder 10/24/2014    Priority: Medium  . Essential hypertension 10/24/2014    Priority: Medium   Past Surgical History:  Procedure Laterality Date  . WISDOM TOOTH EXTRACTION      Family History  Problem Relation Age of Onset  . Stroke Mother        40 at death  . Diabetes Mother   . Hypertension Mother   . Stroke Father        27 at death  . Lung cancer Father        long term smoker  . Skin cancer Father   . Healthy Sister        35 years older than him  . Healthy Brother     Medications- reviewed and updated Current Outpatient Medications  Medication Sig Dispense Refill  . clonazePAM (KLONOPIN) 0.5 MG tablet TAKE 1 TABLET BY MOUTH EVERY DAY AS NEEDED FOR ANXIETY 30 tablet 5  . lisinopril-hydrochlorothiazide (ZESTORETIC) 20-12.5 MG tablet TAKE 1 TABLET BY MOUTH EVERY DAY 90 tablet 1  . Multiple Vitamin (MULTIVITAMIN) tablet Take 1 tablet by mouth daily.    . Omega-3 Fatty Acids (FISH OIL) 1000 MG CAPS Take by mouth.     No current facility-administered medications for this visit.    Allergies-reviewed and updated No Known Allergies  Social History   Social History Narrative   Divorced. With fiancee since 20129. 70 son 63 years old 08/2019 from prior marriage (50/50)      Works with [employer]. Fiancee with [employer]. 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    HS degree      Hobbies: golf, fish, travel-beach, [place], enjoys all sports particular hockey and [employer]      Objective:  BP 122/72   Pulse (!) 104   Temp 97.9 F (36.6 C) (Temporal)   Resp 18   Ht 5' 10"  (1.778 m)   Wt 190 lb 12.8 oz (86.5 kg)   SpO2 98%   BMI 27.38 kg/m  Gen: NAD, resting comfortably HEENT: Mucous membranes are moist. Oropharynx normal Neck: no thyromegaly CV: slightly tachycardiac but regular no murmurs rubs or gallops Lungs: CTAB no crackles, wheeze, rhonchi Abdomen: soft/nontender/nondistended/normal bowel sounds. No rebound or guarding.  Ext: no edema Skin: warm, dry Neuro: grossly normal, moves all extremities, PERRLA     Assessment and Plan:  44 y.o. male presenting for annual physical.  Health Maintenance counseling: 1. Anticipatory guidance: Patient counseled regarding regular dental exams -q6 months, eye exams - yearly,  avoiding smoking and second hand smoke-see discussion below, limiting alcohol to 2 beverages per day-last visit was about 20 beverages per week Michelob ultra- has cut out during the week- weekends 4 per weekend, avoiding any illicit substances .   2.  Risk factor reduction:  Advised patient of need for regular exercise and diet rich and fruits and vegetables to reduce risk of heart attack and stroke. Exercise-last year was meeting personal step goal of 7-8,000 steps per day walking with fianc- has been able to keep this up for the most part usually 7-10k per day. Diet-increased veggies last year and that has helped with weight loss plus cutting down on alcohol .  Wt Readings from Last 3 Encounters:  09/05/20 190 lb 12.8 oz (86.5 kg)  02/28/20 195 lb (88.5 kg)  08/30/19 196 lb 6.4 oz (89.1 kg)  3. Immunizations/screenings/ancillary studies-flu shot today .  Discussed one-time one-time screening for hepatitis C . Discussed  covid 19 booster - he will consider - would be eligible next year Immunization History  Administered Date(s) Administered  . Influenza,inj,Quad PF,6+ Mos 08/30/2019  . Influenza,trivalent, recombinat, inj, PF 08/29/2009, 09/03/2010, 09/06/2012  . Influenza-Unspecified 09/03/2014, 08/04/2015, 09/17/2016, 08/21/2017, 07/27/2018  . Moderna SARS-COVID-2 Vaccination 06/19/2020, 07/17/2020  . Tdap 11/03/2008, 08/29/2009, 02/28/2020  4. Prostate cancer screening-   No family history, start at age 44  5. Colon cancer screening - normal cologuard 02/27/20 repeat in 3 years 6. Skin cancer screening/prevention-saw dermatology x1 a few years ago- dad with skin cancer. advised regular sunscreen use. Denies worrisome, changing, or new skin lesions.  7. Testicular cancer screening- advised monthly self exams  8. STD screening- patient opts out as monogamous with fiance  9.  Current smoker-last visit down to half pack per day- perhaps slightly less now.  Have given him [place] quit line information.  AAA screening at 36 planned.  Lung cancer screening may not qualify as under 20 pack years.  Strongly encourage cessation today.  Also get urinalysis with smoking status -He stopped Chantix as did not tolerate and Wellbutrin previously (felt wired all day)  Status of chronic or acute concerns   #hypertension S: medication: Zestoretic 20-12.5Mg A/P: Stable. Continue current medications.  -nocturia once a night  #hyperlipidemia-LDL over 100 and triglycerides over 400 at times S: Medication: Omega 3 1000Mg A/P: very high last visit- has cut down alcohol drastically by over 75%- recheck triglycerides today  # GAD S:Medication: Klonopin 0.5Mg mostly at night to help with sleep- has been using slightly less Counseling: Has not been beneficial for him in the past Prior to: Cymbalta, Lexapro, Paxil, Zoloft without benefit A/P: reasonable control- continue current medicine   #OSA-compliant with CPAP regularly    Recommended follow up: Return in about 6 months (around 03/05/2021) for follow up- or sooner if needed.  Lab/Order associations: fasting   ICD-10-CM   1. Preventative health care  H63.14 COMPLETE METABOLIC PANEL WITH GFR    CBC with Differential/Platelet    Hepatitis C antibody    Lipid Panel w/reflex Direct LDL    POCT Urinalysis Dipstick (Automated)  2. Essential hypertension  H70 COMPLETE METABOLIC PANEL WITH GFR    CBC with Differential/Platelet    Lipid Panel w/reflex Direct LDL    POCT Urinalysis Dipstick (Automated)  3. Generalized anxiety disorder  F41.1   4. Hyperlipidemia, unspecified hyperlipidemia type  Y63.7 COMPLETE METABOLIC PANEL WITH GFR    CBC with Differential/Platelet    Lipid Panel w/reflex Direct LDL  5. Encounter for hepatitis C screening test for low risk patient  Z11.59 Hepatitis C antibody  6. Current smoker  F17.200 POCT Urinalysis Dipstick (Automated)    No orders of the defined types were placed in this encounter.  Return precautions advised.  Garret Reddish,  MD   

## 2020-09-05 ENCOUNTER — Other Ambulatory Visit: Payer: Self-pay

## 2020-09-05 ENCOUNTER — Ambulatory Visit (INDEPENDENT_AMBULATORY_CARE_PROVIDER_SITE_OTHER): Payer: BC Managed Care – PPO | Admitting: Family Medicine

## 2020-09-05 ENCOUNTER — Other Ambulatory Visit: Payer: Self-pay | Admitting: *Deleted

## 2020-09-05 ENCOUNTER — Encounter: Payer: Self-pay | Admitting: Family Medicine

## 2020-09-05 ENCOUNTER — Other Ambulatory Visit: Payer: Self-pay | Admitting: Family Medicine

## 2020-09-05 VITALS — BP 122/72 | HR 104 | Temp 97.9°F | Resp 18 | Ht 70.0 in | Wt 190.8 lb

## 2020-09-05 DIAGNOSIS — I10 Essential (primary) hypertension: Secondary | ICD-10-CM | POA: Diagnosis not present

## 2020-09-05 DIAGNOSIS — E785 Hyperlipidemia, unspecified: Secondary | ICD-10-CM

## 2020-09-05 DIAGNOSIS — Z23 Encounter for immunization: Secondary | ICD-10-CM

## 2020-09-05 DIAGNOSIS — Z0000 Encounter for general adult medical examination without abnormal findings: Secondary | ICD-10-CM

## 2020-09-05 DIAGNOSIS — F17200 Nicotine dependence, unspecified, uncomplicated: Secondary | ICD-10-CM

## 2020-09-05 DIAGNOSIS — Z1159 Encounter for screening for other viral diseases: Secondary | ICD-10-CM | POA: Diagnosis not present

## 2020-09-05 DIAGNOSIS — F411 Generalized anxiety disorder: Secondary | ICD-10-CM

## 2020-09-05 MED ORDER — CLONAZEPAM 0.5 MG PO TABS: ORAL_TABLET | ORAL | 5 refills | Status: DC

## 2020-09-06 LAB — COMPLETE METABOLIC PANEL WITH GFR
AG Ratio: 1.9 (calc) (ref 1.0–2.5)
ALT: 31 U/L (ref 9–46)
AST: 23 U/L (ref 10–40)
Albumin: 4.8 g/dL (ref 3.6–5.1)
Alkaline phosphatase (APISO): 66 U/L (ref 36–130)
BUN: 15 mg/dL (ref 7–25)
CO2: 24 mmol/L (ref 20–32)
Calcium: 9.8 mg/dL (ref 8.6–10.3)
Chloride: 104 mmol/L (ref 98–110)
Creat: 1.01 mg/dL (ref 0.60–1.35)
GFR, Est African American: 103 mL/min/{1.73_m2} (ref >=60)
GFR, Est Non African American: 89 mL/min/{1.73_m2} (ref >=60)
Globulin: 2.5 g/dL (calc) (ref 1.9–3.7)
Glucose, Bld: 99 mg/dL (ref 65–99)
Potassium: 4.4 mmol/L (ref 3.5–5.3)
Sodium: 137 mmol/L (ref 135–146)
Total Bilirubin: 0.6 mg/dL (ref 0.2–1.2)
Total Protein: 7.3 g/dL (ref 6.1–8.1)

## 2020-09-06 LAB — CBC WITH DIFFERENTIAL/PLATELET
Absolute Monocytes: 711 cells/uL (ref 200–950)
Basophils Absolute: 62 cells/uL (ref 0–200)
Basophils Relative: 0.9 %
Eosinophils Absolute: 193 cells/uL (ref 15–500)
Eosinophils Relative: 2.8 %
HCT: 43.8 % (ref 38.5–50.0)
Hemoglobin: 15.5 g/dL (ref 13.2–17.1)
Lymphs Abs: 1925 cells/uL (ref 850–3900)
MCH: 33.2 pg — ABNORMAL HIGH (ref 27.0–33.0)
MCHC: 35.4 g/dL (ref 32.0–36.0)
MCV: 93.8 fL (ref 80.0–100.0)
MPV: 9.4 fL (ref 7.5–12.5)
Monocytes Relative: 10.3 %
Neutro Abs: 4009 cells/uL (ref 1500–7800)
Neutrophils Relative %: 58.1 %
Platelets: 273 10*3/uL (ref 140–400)
RBC: 4.67 10*6/uL (ref 4.20–5.80)
RDW: 12.5 % (ref 11.0–15.0)
Total Lymphocyte: 27.9 %
WBC: 6.9 10*3/uL (ref 3.8–10.8)

## 2020-09-06 LAB — LIPID PANEL W/REFLEX DIRECT LDL
Cholesterol: 227 mg/dL — ABNORMAL HIGH (ref <200)
HDL: 40 mg/dL (ref >=40)
LDL Cholesterol (Calc): 139 mg/dL (calc) — ABNORMAL HIGH
Non-HDL Cholesterol (Calc): 187 mg/dL (calc) — ABNORMAL HIGH (ref <130)
Total CHOL/HDL Ratio: 5.7 (calc) — ABNORMAL HIGH (ref <5.0)
Triglycerides: 335 mg/dL — ABNORMAL HIGH (ref <150)

## 2020-09-06 LAB — HEPATITIS C ANTIBODY
Hepatitis C Ab: NONREACTIVE
SIGNAL TO CUT-OFF: 0.02 (ref <1.00)

## 2020-10-08 DIAGNOSIS — G4733 Obstructive sleep apnea (adult) (pediatric): Secondary | ICD-10-CM | POA: Diagnosis not present

## 2020-11-13 ENCOUNTER — Encounter: Payer: Self-pay | Admitting: Family Medicine

## 2021-03-02 ENCOUNTER — Other Ambulatory Visit: Payer: Self-pay | Admitting: Family Medicine

## 2021-03-04 ENCOUNTER — Other Ambulatory Visit: Payer: Self-pay | Admitting: Family Medicine

## 2021-03-04 NOTE — Progress Notes (Deleted)
  Phone [phone] In person visit   Subjective:   Shane Matthews is a 45 y.o. year old very pleasant male patient who presents for/with See problem oriented charting No chief complaint on file.   This visit occurred during the SARS-CoV-2 public health emergency.  Safety protocols were in place, including screening questions prior to the visit, additional usage of staff PPE, and extensive cleaning of exam room while observing appropriate contact time as indicated for disinfecting solutions.   Past Medical History-  Patient Active Problem List   Diagnosis Date Noted  . OSA (obstructive sleep apnea) 03/27/2017  . Current smoker 11/13/2016  . Hyperlipidemia 10/24/2014  . Generalized anxiety disorder 10/24/2014  . Essential hypertension 10/24/2014    Medications- reviewed and updated Current Outpatient Medications  Medication Sig Dispense Refill  . clonazePAM (KLONOPIN) 0.5 MG tablet TAKE 1 TABLET BY MOUTH EVERY DAY AS NEEDED FOR ANXIETY 30 tablet 5  . lisinopril-hydrochlorothiazide (ZESTORETIC) 20-12.5 MG tablet TAKE 1 TABLET BY MOUTH EVERY DAY 90 tablet 1  . Multiple Vitamin (MULTIVITAMIN) tablet Take 1 tablet by mouth daily.    . Omega-3 Fatty Acids (FISH OIL) 1000 MG CAPS Take by mouth.     No current facility-administered medications for this visit.     Objective:  There were no vitals taken for this visit. Gen: NAD, resting comfortably CV: RRR no murmurs rubs or gallops Lungs: CTAB no crackles, wheeze, rhonchi Abdomen: soft/nontender/nondistended/normal bowel sounds. No rebound or guarding.  Ext: no edema Skin: warm, dry Neuro: grossly normal, moves all extremities  ***    Assessment and Plan  *** #hypertension S: medication: Zestoretic 20-12.5Mg A/P: ***  #hyperlipidemia-LDL over 100 and triglycerides over 400 at times S: Medication: Omega 3 1000Mg A/P: ***  # GAD S:Medication: Klonopin 0.5Mg mostly at night to help with sleep Counseling: ***Has not been  beneficial for him in the past Prior to: Cymbalta, Lexapro, Paxil, Zoloft without benefit A/P: ***   #OSA-compliant with CPAP***   #Current smoker-***    No problem-specific Assessment & Plan notes found for this encounter.   Recommended follow up: ***No follow-ups on file. Future Appointments  Date Time Provider [place]  03/05/2021  3:00 PM Shane Olp, MD [facility]  09/09/2021  8:00 AM Shane Olp, MD [facility]    Lab/Order associations: No diagnosis found.  No orders of the defined types were placed in this encounter.   Time Spent: *** minutes of total time (9:05 PM***- 9:05 PM***) was spent on the date of the encounter performing the following actions: chart review prior to seeing the patient, obtaining history, performing a medically necessary exam, counseling on the treatment plan, placing orders, and documenting in our EHR.   Return precautions advised.  Shane Matthews, CMA

## 2021-03-04 NOTE — Patient Instructions (Incomplete)
Health Maintenance Due  Topic Date Due  . COVID-19 Vaccine (3 - Booster for Moderna series) 01/14/2021   Depression screen [facility] 2/9 09/05/2020 08/30/2019 07/07/2018  Decreased Interest 0 0 0  Down, Depressed, Hopeless 0 0 0  PHQ - 2 Score 0 0 0  Altered sleeping - 1 -  Tired, decreased energy - 0 -  Change in appetite - 0 -  Feeling bad or failure about yourself  - 0 -  Trouble concentrating - 0 -  Moving slowly or fidgety/restless - 0 -  Suicidal thoughts - 0 -  PHQ-9 Score - 1 -  Difficult doing work/chores - Not difficult at all -

## 2021-03-05 ENCOUNTER — Ambulatory Visit: Payer: BC Managed Care – PPO | Admitting: Family Medicine

## 2021-03-05 DIAGNOSIS — E785 Hyperlipidemia, unspecified: Secondary | ICD-10-CM

## 2021-03-05 DIAGNOSIS — I10 Essential (primary) hypertension: Secondary | ICD-10-CM

## 2021-03-05 DIAGNOSIS — F411 Generalized anxiety disorder: Secondary | ICD-10-CM

## 2021-03-05 NOTE — Progress Notes (Signed)
Phone [phone] In person visit   Subjective:   Shane Matthews is a 45 y.o. year old very pleasant male patient who presents for/with See problem oriented charting Chief Complaint  Patient presents with  . Hyperlipidemia  . Hypertension  . Anxiety  . Sleep Apnea   This visit occurred during the SARS-CoV-2 public health emergency.  Safety protocols were in place, including screening questions prior to the visit, additional usage of staff PPE, and extensive cleaning of exam room while observing appropriate contact time as indicated for disinfecting solutions.   Past Medical History-  Patient Active Problem List   Diagnosis Date Noted  . Current smoker 11/13/2016    Priority: High  . OSA (obstructive sleep apnea) 03/27/2017    Priority: Medium  . Hyperlipidemia 10/24/2014    Priority: Medium  . Generalized anxiety disorder 10/24/2014    Priority: Medium  . Essential hypertension 10/24/2014    Priority: Medium    Medications- reviewed and updated Current Outpatient Medications  Medication Sig Dispense Refill  . Multiple Vitamin (MULTIVITAMIN) tablet Take 1 tablet by mouth daily.    . clonazePAM (KLONOPIN) 0.5 MG tablet TAKE 1 TABLET BY MOUTH EVERY DAY AS NEEDED FOR ANXIETY 30 tablet 5  . lisinopril-hydrochlorothiazide (ZESTORETIC) 20-12.5 MG tablet Take 1 tablet by mouth daily. 90 tablet 3   No current facility-administered medications for this visit.     Objective:  BP 136/86   Pulse 88   Temp 98.2 F (36.8 C) (Temporal)   Ht 5' 10"  (1.778 m)   Wt 199 lb 6.4 oz (90.4 kg)   SpO2 95%   BMI 28.61 kg/m  Gen: NAD, resting comfortably CV: RRR no murmurs rubs or gallops Lungs: CTAB no crackles, wheeze, rhonchi Abdomen: soft/nontender/nondistended/normal bowel sounds. No rebound or guarding.  Scabbed over tick bite on abdomen- no flu like symptoms or target lesion. Less than a day attached Ext: no edema Skin: warm, dry    Assessment and Plan   #hypertension S:  medication: Zestoretic 20-12.5Mg -nocturia once a night -home readings 119/81 on last check BP Readings from Last 3 Encounters:  03/06/21 136/86  09/05/20 122/72  02/28/20 118/80  A/P: Stable. Continue current medications.   #hyperlipidemia-LDL over 100 and triglycerides over 400 at times S: Medication: Omega 3 1000Mg- out for a few weeks - last visit had cut alcohol by 75% and triglycerides at least dropped under 400. Alcohol perhaps up to 12-14 per week Lab Results  Component Value Date   CHOL 227 (H) 09/05/2020   HDL 40 09/05/2020   LDLCALC 139 (H) 09/05/2020   LDLDIRECT 114.0 08/30/2019   TRIG 335 (H) 09/05/2020   CHOLHDL 5.7 (H) 09/05/2020  A/P: poor control- advised to cut out alcohol ideally- would help triglycerices and work on weight loss -doing some walking  # GAD/insomnia S:Medication: Klonopin 0.5Mg mostly at night to help with sleep. Not drinking on nights he has clonazepam Counseling: Has not been beneficial for him in the past Prior trial: Cymbalta, Lexapro, Paxil, Zoloft without benefit A/P: reasonable control- advised not to drink within 6 hours of klonopin being used. Sparing use- needs a refill    #OSA-compliant with CPAP   #Current smoker- half pack per day- recommended full essation- not ready to quit -felt wired on wellbutrin and didn't tolerate chantix  Recommended follow up: keep November physical Future Appointments  Date Time Provider [place]  09/09/2021  8:00 AM Marin Olp, MD [facility]   Lab/Order associations:   ICD-10-CM  1. Essential hypertension  I10   2. Hyperlipidemia, unspecified hyperlipidemia type  E78.5   3. Generalized anxiety disorder  F41.1     Meds ordered this encounter  Medications  . lisinopril-hydrochlorothiazide (ZESTORETIC) 20-12.5 MG tablet    Sig: Take 1 tablet by mouth daily.    Dispense:  90 tablet    Refill:  3  . clonazePAM (KLONOPIN) 0.5 MG tablet    Sig: TAKE 1 TABLET BY MOUTH EVERY DAY AS  NEEDED FOR ANXIETY    Dispense:  30 tablet    Refill:  5    Not to exceed 5 additional fills before 01/08/2020   Return precautions advised.  Garret Reddish, MD

## 2021-03-05 NOTE — Patient Instructions (Addendum)
Try to tackle at least one of the following goals 1. Reduce alcohol intake (which may also help with weight) 2. Quit smoking 3. Add regular exercise (start slow and build your way up) 4. Reduce calorie intake (myfitnesspal or check out the leslie diet on youtube. Dr. Francine Graven does a talk on this)   Recommended follow up: keep November physical- will do labs at that time again

## 2021-03-06 ENCOUNTER — Ambulatory Visit (INDEPENDENT_AMBULATORY_CARE_PROVIDER_SITE_OTHER): Payer: BC Managed Care – PPO | Admitting: Family Medicine

## 2021-03-06 ENCOUNTER — Encounter: Payer: Self-pay | Admitting: Family Medicine

## 2021-03-06 ENCOUNTER — Other Ambulatory Visit: Payer: Self-pay

## 2021-03-06 VITALS — BP 136/86 | HR 88 | Temp 98.2°F | Ht 70.0 in | Wt 199.4 lb

## 2021-03-06 DIAGNOSIS — I10 Essential (primary) hypertension: Secondary | ICD-10-CM | POA: Diagnosis not present

## 2021-03-06 DIAGNOSIS — E785 Hyperlipidemia, unspecified: Secondary | ICD-10-CM

## 2021-03-06 DIAGNOSIS — F411 Generalized anxiety disorder: Secondary | ICD-10-CM

## 2021-03-06 MED ORDER — CLONAZEPAM 0.5 MG PO TABS
ORAL_TABLET | ORAL | 5 refills | Status: DC
Start: 2021-03-06 — End: 2021-09-04

## 2021-03-06 MED ORDER — LISINOPRIL-HYDROCHLOROTHIAZIDE 20-12.5 MG PO TABS
1.0000 | ORAL_TABLET | Freq: Every day | ORAL | 3 refills | Status: DC
Start: 2021-03-06 — End: 2021-09-09

## 2021-09-02 NOTE — Progress Notes (Signed)
Phone: [phone]    Subjective:  Patient presents today for their annual physical. Chief complaint-noted.   See problem oriented charting- Review of Systems  Constitutional:  Negative for chills and fever.  HENT:  Negative for ear pain and hearing loss.   Eyes:  Negative for blurred vision and double vision.  Respiratory:  Negative for cough and shortness of breath.   Cardiovascular:  Negative for chest pain and palpitations.  Gastrointestinal:  Negative for abdominal pain, blood in stool, constipation, diarrhea, heartburn, melena, nausea and vomiting.  Genitourinary:  Positive for frequency (some with hctz). Negative for dysuria.  Musculoskeletal:  Negative for back pain, myalgias and neck pain.  Skin:  Negative for itching and rash.  Neurological:  Negative for dizziness and headaches.  Endo/Heme/Allergies:  Negative for polydipsia. Does not bruise/bleed easily.  Psychiatric/Behavioral:  Negative for depression and suicidal ideas.    The following were reviewed and entered/updated in epic: Past Medical History:  Diagnosis Date   Anxiety    Chicken pox    GERD (gastroesophageal reflux disease)    sporadic   HTN (hypertension)    Insomnia    Patient Active Problem List   Diagnosis Date Noted   Current smoker 11/13/2016    Priority: High   OSA (obstructive sleep apnea) 03/27/2017    Priority: Medium    Hyperlipidemia 10/24/2014    Priority: Medium    Generalized anxiety disorder 10/24/2014    Priority: Medium    Essential hypertension 10/24/2014    Priority: Medium    Past Surgical History:  Procedure Laterality Date   WISDOM TOOTH EXTRACTION      Family History  Problem Relation Age of Onset   Stroke Mother        25 at death   Diabetes Mother    Hypertension Mother    Stroke Father        5 at death   Lung cancer Father        long term smoker   Skin cancer Father    Healthy Sister        60 years older than him   Healthy Brother      Medications- reviewed and updated Current Outpatient Medications  Medication Sig Dispense Refill   Multiple Vitamin (MULTIVITAMIN) tablet Take 1 tablet by mouth daily.     clonazePAM (KLONOPIN) 0.5 MG tablet TAKE 1 TABLET BY MOUTH EVERY DAY AS NEEDED FOR ANXIETY 30 tablet 5   lisinopril-hydrochlorothiazide (ZESTORETIC) 20-12.5 MG tablet Take 1 tablet by mouth daily. 90 tablet 3   No current facility-administered medications for this visit.    Allergies-reviewed and updated No Known Allergies  Social History   Social History Narrative   Divorced. With fiancee since [address]76. 66 son 68 years old 08/2019 from prior marriage (50/50)      Works with [employer]. Fiancee with [employer]. 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    HS degree      Hobbies: golf, fish, travel-beach, [place], enjoys all sports particular hockey and [employer]      Objective:  BP 124/80   Pulse 88   Temp 98.2 F (36.8 C)   Ht 5' 10"  (1.778 m)   Wt 196 lb 9.6 oz (89.2 kg)   SpO2 96%   BMI 28.21 kg/m  Gen: NAD, resting comfortably HEENT: Mucous membranes are moist. Oropharynx normal Neck: no thyromegaly CV: RRR no murmurs rubs or gallops Lungs: CTAB no crackles, wheeze, rhonchi Abdomen: soft/nontender/nondistended/normal bowel sounds.  No rebound or guarding.  Ext: no edema Skin: warm, dry, scaly lesion on nose in 2 spots with erythematous base- possible AK- advised dermatology follow up Neuro: grossly normal, moves all extremities, PERRLA     Assessment and Plan:  45 y.o. male presenting for annual physical.  Health Maintenance counseling: 1. Anticipatory guidance: Patient counseled regarding regular dental exams -q6 months, eye exams- yearly,  avoiding smoking and second hand smoke- see below , limiting alcohol to 2 beverages per day- has kept down 08-81 alcoholic beverages per day. Prior up to 20 in the past, avoiding any illicit substances . no illicit drugs.    2. Risk factor reduction:  Advised patient of need for regular exercise and diet rich and fruits and vegetables to reduce risk of heart attack and stroke. [place] Kitchen Exercise-last year was meeting personal step goal of 7-8,000 steps per day walking with fianc- has been able to keep this up for the most part usually 7-10k per day- has continued this but feels may need to push himself more outside of work. Diet- thinks less alcohol has helped weight come back down.  Wt Readings from Last 3 Encounters:  09/09/21 196 lb 9.6 oz (89.2 kg)  03/06/21 199 lb 6.4 oz (90.4 kg)  09/05/20 190 lb 12.8 oz (86.5 kg)  3. Immunizations/screenings/ancillary studies Discussed:  - flu vaccination (last one 11/21) - today -Prevnar-20 vaccination #1- prefers to wait for now Immunization History  Administered Date(s) Administered   Influenza,inj,Quad PF,6+ Mos 08/30/2019, 09/05/2020   Influenza,trivalent, recombinat, inj, PF 08/29/2009, 09/03/2010, 09/06/2012   Influenza-Unspecified 09/03/2014, 08/04/2015, 09/17/2016, 08/21/2017, 07/27/2018   Moderna Sars-Covid-2 Vaccination 06/19/2020, 07/17/2020   Tdap 11/03/2008, 08/29/2009, 02/28/2020  4. Prostate cancer screening- No family history, start at age 69  5. Colon cancer screening - normal cologuard 02/27/20 repeat in 3 years 6. Skin cancer screening/prevention- saw dermatology x1 a few years ago- dad with skin cancer. advised regular sunscreen use. Denies worrisome, changing, or new skin lesions. Other than nose 7. Testicular cancer screening- advised monthly self exams  8. STD screening- patient opts out as monogamous with fiance  9. Smoking associated screening- Current smoker- 8-10 per day. Motivation comes and goes to quit. Prior Have given him [place] quit line information.  AAA screening at 10 planned.  Lung cancer screening may not qualify as under 20 pack years.  Strongly encourage cessation today- not ready.  Also get urinalysis with smoking status. Has quit smoking in  past.  -He stopped Chantix as did not tolerate and Wellbutrin previously (felt wired all day)  -discussed gum vs. patches   Status of chronic or acute concerns     #hypertension S: medication: Zestoretic 20-12.5Mg daily -nocturia once a night BP Readings from Last 3 Encounters:  09/09/21 124/80  03/06/21 136/86  09/05/20 122/72  A/P: Controlled. Continue current medications.    #hyperlipidemia-LDL over 100 and triglycerides over 400 at times S: Medication: Omega 3 1000Mg - last visit had cut alcohol by 75% and triglycerides at least dropped under 400. Alcohol perhaps up to 12-14 per week still  -thinks screening at work was at 200 or less recently on triglycerides on fingerprick Lab Results  Component Value Date   CHOL 227 (H) 09/05/2020   HDL 40 09/05/2020   LDLCALC 139 (H) 09/05/2020   LDLDIRECT 114.0 08/30/2019   TRIG 335 (H) 09/05/2020   CHOLHDL 5.7 (H) 09/05/2020   A/P: hopefully improved- update lipid panel today. Continue current meds for now  # GAD/insomnia S:Medication: Klonopin  0.5Mg as needed mostly at night to help with sleep. Not drinking on nights he has clonazepam - still deals with anxiety Counseling: Had not been beneficial for him in the past Prior trial: Cymbalta, Lexapro, Paxil, Zoloft without benefit -We have also tried doxepin and Remeron A/P: imperfect but still helpful    #OSA-compliant with CPAP    Recommended follow up: No follow-ups on file.  Lab/Order associations: fasting   ICD-10-CM   1. Preventative health care  Z00.00     2. Essential hypertension  I10 CBC with Differential/Platelet    Comprehensive metabolic panel    Lipid panel    POCT Urinalysis Dipstick (Automated)    3. Hyperlipidemia, unspecified hyperlipidemia type  E78.5 CBC with Differential/Platelet    Comprehensive metabolic panel    Lipid panel    POCT Urinalysis Dipstick (Automated)    CT CARDIAC SCORING (SELF PAY ONLY)    4. Need for immunization against influenza   Z23     5. OSA (obstructive sleep apnea)  G47.33     6. Current smoker  F17.200 POCT Urinalysis Dipstick (Automated)    7. Generalized anxiety disorder  F41.1       Meds ordered this encounter  Medications   lisinopril-hydrochlorothiazide (ZESTORETIC) 20-12.5 MG tablet    Sig: Take 1 tablet by mouth daily.    Dispense:  90 tablet    Refill:  3   clonazePAM (KLONOPIN) 0.5 MG tablet    Sig: TAKE 1 TABLET BY MOUTH EVERY DAY AS NEEDED FOR ANXIETY    Dispense:  30 tablet    Refill:  5    This request is for a new prescription for a controlled substance as required by Federal/State law.     I,Jada Bradford,acting as a scribe for Garret Reddish, MD.,have documented all relevant documentation on the behalf of Garret Reddish, MD,as directed by  Garret Reddish, MD while in the presence of Garret Reddish, MD.  I, Garret Reddish, MD, have reviewed all documentation for this visit. The documentation on 09/09/21 for the exam, diagnosis, procedures, and orders are all accurate and complete.  Return precautions advised.   Garret Reddish, MD

## 2021-09-04 ENCOUNTER — Other Ambulatory Visit: Payer: Self-pay | Admitting: Family Medicine

## 2021-09-04 NOTE — Telephone Encounter (Signed)
RX Refill: klonopin Last Seen: 03-06-21 Last Ordered: 03-06-21 Next Appt: 09-09-21

## 2021-09-09 ENCOUNTER — Other Ambulatory Visit: Payer: Self-pay

## 2021-09-09 ENCOUNTER — Ambulatory Visit (INDEPENDENT_AMBULATORY_CARE_PROVIDER_SITE_OTHER): Payer: BC Managed Care – PPO | Admitting: Family Medicine

## 2021-09-09 ENCOUNTER — Encounter: Payer: Self-pay | Admitting: Family Medicine

## 2021-09-09 VITALS — BP 124/80 | HR 88 | Temp 98.2°F | Ht 70.0 in | Wt 196.6 lb

## 2021-09-09 DIAGNOSIS — E785 Hyperlipidemia, unspecified: Secondary | ICD-10-CM | POA: Diagnosis not present

## 2021-09-09 DIAGNOSIS — Z23 Encounter for immunization: Secondary | ICD-10-CM

## 2021-09-09 DIAGNOSIS — I10 Essential (primary) hypertension: Secondary | ICD-10-CM

## 2021-09-09 DIAGNOSIS — F411 Generalized anxiety disorder: Secondary | ICD-10-CM

## 2021-09-09 DIAGNOSIS — Z0000 Encounter for general adult medical examination without abnormal findings: Secondary | ICD-10-CM

## 2021-09-09 DIAGNOSIS — F17200 Nicotine dependence, unspecified, uncomplicated: Secondary | ICD-10-CM | POA: Diagnosis not present

## 2021-09-09 DIAGNOSIS — G4733 Obstructive sleep apnea (adult) (pediatric): Secondary | ICD-10-CM

## 2021-09-09 LAB — POC URINALSYSI DIPSTICK (AUTOMATED)
Bilirubin, UA: NEGATIVE
Blood, UA: NEGATIVE
Glucose, UA: NEGATIVE
Ketones, UA: NEGATIVE
Leukocytes, UA: NEGATIVE
Nitrite, UA: NEGATIVE
Protein, UA: NEGATIVE
Spec Grav, UA: 1.01 (ref 1.010–1.025)
Urobilinogen, UA: 0.2 E.U./dL
pH, UA: 6.5 (ref 5.0–8.0)

## 2021-09-09 LAB — COMPREHENSIVE METABOLIC PANEL
ALT: 35 U/L (ref 0–53)
AST: 23 U/L (ref 0–37)
Albumin: 4.8 g/dL (ref 3.5–5.2)
Alkaline Phosphatase: 69 U/L (ref 39–117)
BUN: 16 mg/dL (ref 6–23)
CO2: 28 mEq/L (ref 19–32)
Calcium: 9.6 mg/dL (ref 8.4–10.5)
Chloride: 102 mEq/L (ref 96–112)
Creatinine, Ser: 0.99 mg/dL (ref 0.40–1.50)
GFR: 90.64 mL/min (ref >60.00)
Glucose, Bld: 88 mg/dL (ref 70–99)
Potassium: 4.5 mEq/L (ref 3.5–5.1)
Sodium: 138 mEq/L (ref 135–145)
Total Bilirubin: 0.6 mg/dL (ref 0.2–1.2)
Total Protein: 7.1 g/dL (ref 6.0–8.3)

## 2021-09-09 LAB — CBC WITH DIFFERENTIAL/PLATELET
Basophils Absolute: 0 10*3/uL (ref 0.0–0.1)
Basophils Relative: 0.7 % (ref 0.0–3.0)
Eosinophils Absolute: 0.2 10*3/uL (ref 0.0–0.7)
Eosinophils Relative: 2.2 % (ref 0.0–5.0)
HCT: 42.6 % (ref 39.0–52.0)
Hemoglobin: 14.4 g/dL (ref 13.0–17.0)
Lymphocytes Relative: 28.9 % (ref 12.0–46.0)
Lymphs Abs: 2.2 10*3/uL (ref 0.7–4.0)
MCHC: 33.8 g/dL (ref 30.0–36.0)
MCV: 94 fl (ref 78.0–100.0)
Monocytes Absolute: 0.6 10*3/uL (ref 0.1–1.0)
Monocytes Relative: 8.5 % (ref 3.0–12.0)
Neutro Abs: 4.4 10*3/uL (ref 1.4–7.7)
Neutrophils Relative %: 59.7 % (ref 43.0–77.0)
Platelets: 257 10*3/uL (ref 150.0–400.0)
RBC: 4.53 Mil/uL (ref 4.22–5.81)
RDW: 12.7 % (ref 11.5–15.5)
WBC: 7.4 10*3/uL (ref 4.0–10.5)

## 2021-09-09 LAB — LDL CHOLESTEROL, DIRECT: Direct LDL: 107 mg/dL

## 2021-09-09 LAB — LIPID PANEL
Cholesterol: 213 mg/dL — ABNORMAL HIGH (ref 0–200)
HDL: 41.1 mg/dL (ref >39.00)
Total CHOL/HDL Ratio: 5
Triglycerides: 423 mg/dL — ABNORMAL HIGH (ref 0.0–149.0)

## 2021-09-09 MED ORDER — CLONAZEPAM 0.5 MG PO TABS: ORAL_TABLET | ORAL | 5 refills | Status: DC

## 2021-09-09 MED ORDER — LISINOPRIL-HYDROCHLOROTHIAZIDE 20-12.5 MG PO TABS: 1.0000 | ORAL_TABLET | Freq: Every day | ORAL | 3 refills | Status: DC

## 2021-09-09 NOTE — Patient Instructions (Addendum)
-  Health Maintenance Due  Topic Date Due   Pneumococcal Vaccine 54-47 Years old (1 - PCV)- Hold off [id number] shot at this time.  Never done   INFLUENZA VACCINE - Regular dose flu shot done today in the office. 06/03/2021   We will call you within two weeks about your referral to Coronary Calcium Scoring . If you do not hear within 2 weeks, give [place] a call.   Please schedule a dermatology follow-up.  I love your motivation to exercise! Try to exercise 150 minutes per week (30 minutes a day outside of work)!   Please trial the 7 mg nicotine patch to help with your smoking!  Please stop by lab before you go If you have mychart- we will send your results within 3 business days of [place] receiving them.  If you do not have mychart- we will call you about results within 5 business days of [place] receiving them.  *please also note that you will see labs on mychart as soon as they post. I will later go in and write notes on them- will say "notes from Dr. Yong Channel"  Recommended follow up: Return in about 6 months (around 03/09/2022) for follow-up or sooner if needed.

## 2021-09-09 NOTE — Addendum Note (Signed)
Addended by: Loura Back on: 09/09/2021 12:13 PM   Modules accepted: Orders

## 2021-09-17 ENCOUNTER — Encounter: Payer: Self-pay | Admitting: Dermatology

## 2021-09-17 ENCOUNTER — Other Ambulatory Visit: Payer: Self-pay

## 2021-09-17 ENCOUNTER — Ambulatory Visit (INDEPENDENT_AMBULATORY_CARE_PROVIDER_SITE_OTHER): Payer: BC Managed Care – PPO | Admitting: Dermatology

## 2021-09-17 DIAGNOSIS — L918 Other hypertrophic disorders of the skin: Secondary | ICD-10-CM

## 2021-09-17 DIAGNOSIS — L111 Transient acantholytic dermatosis [Grover]: Secondary | ICD-10-CM

## 2021-09-17 DIAGNOSIS — Z1283 Encounter for screening for malignant neoplasm of skin: Secondary | ICD-10-CM | POA: Diagnosis not present

## 2021-09-17 DIAGNOSIS — L570 Actinic keratosis: Secondary | ICD-10-CM | POA: Diagnosis not present

## 2021-10-02 ENCOUNTER — Ambulatory Visit (INDEPENDENT_AMBULATORY_CARE_PROVIDER_SITE_OTHER)
Admission: RE | Admit: 2021-10-02 | Discharge: 2021-10-02 | Disposition: A | Discharge status: Home / Self Care | Payer: Self-pay | Source: Ambulatory Visit | Attending: Family Medicine | Admitting: Family Medicine

## 2021-10-02 ENCOUNTER — Other Ambulatory Visit: Payer: Self-pay

## 2021-10-02 DIAGNOSIS — E785 Hyperlipidemia, unspecified: Secondary | ICD-10-CM

## 2021-10-07 ENCOUNTER — Other Ambulatory Visit: Payer: Self-pay

## 2021-10-07 ENCOUNTER — Ambulatory Visit (INDEPENDENT_AMBULATORY_CARE_PROVIDER_SITE_OTHER): Payer: BC Managed Care – PPO | Admitting: Dermatology

## 2021-10-07 ENCOUNTER — Encounter: Payer: Self-pay | Admitting: Dermatology

## 2021-10-07 DIAGNOSIS — D044 Carcinoma in situ of skin of scalp and neck: Secondary | ICD-10-CM

## 2021-10-07 DIAGNOSIS — L570 Actinic keratosis: Secondary | ICD-10-CM | POA: Diagnosis not present

## 2021-10-07 DIAGNOSIS — D485 Neoplasm of uncertain behavior of skin: Secondary | ICD-10-CM

## 2021-10-07 NOTE — Patient Instructions (Signed)

## 2021-10-14 ENCOUNTER — Telehealth: Payer: Self-pay | Admitting: *Deleted

## 2021-10-14 ENCOUNTER — Encounter: Payer: Self-pay | Admitting: Dermatology

## 2021-10-14 NOTE — Progress Notes (Signed)
   New Patient   Subjective  Curt Oatis is a 45 y.o. male who presents for the following: Annual Exam.  General skin examination, growth on neck and crusts on scalp Location:  Duration:  Quality:  Associated Signs/Symptoms: Modifying Factors:  Severity:  Timing: Context:    The following portions of the chart were reviewed this encounter and updated as appropriate:  Tobacco  Allergies  Meds  Problems  Med Hx  Surg Hx  Fam Hx      Objective  Well appearing patient in no apparent distress; mood and affect are within normal limits. Scalp Half dozen gritty pink crusts  Waist Up Waist up exam today no signs of melanoma, possible non mole skin cancer on the left side of patient's neck. Deferred treatment on neck due to patient's trip to the [place].  Chest (Upper Torso, Anterior) 1-2 dozen to millimeter monomorphic pink slightly inflamed papules  Left Axilla Fleshy, skin-colored pedunculated papules.      All skin waist up examined.   Assessment & Plan  AK (actinic keratosis) Scalp  Defer intervention until after trip  Skin exam for malignant neoplasm Waist Up  Return for biopsy of neck lesion after trip.  Yearly skin check  Grover's disease Chest (Upper Torso, Anterior)  No itching, will defer intervention  Skin tag Left Axilla  Benign okay to leave unless patient wants removed.

## 2021-10-14 NOTE — Telephone Encounter (Signed)
-----   Message from Lavonna Monarch, MD sent at 10/11/2021  7:39 AM EST ----- Please inform patient that although this is a superficial nonmole skin cancer, the base was treated with the biopsy and if it heals smooth no further procedure may be necessary.  We will plan on rechecking in 3 to 6 months.

## 2021-10-14 NOTE — Telephone Encounter (Signed)
Pathology to patient- 6 month follow up visit scheduled.

## 2021-10-27 ENCOUNTER — Encounter: Payer: Self-pay | Admitting: Dermatology

## 2021-10-27 NOTE — Progress Notes (Signed)
° °  Follow-Up Visit   Subjective  Shane Matthews is a 45 y.o. male who presents for the following: Follow-up (Patient here today for biopsy of lesion on left side of his neck that was deferred at his last visit due to trip to the [place]. ).  Biopsy and possibly treat crust on neck, also crusts on hand and nose Location:  Duration:  Quality:  Associated Signs/Symptoms: Modifying Factors:  Severity:  Timing: Context:   Objective  Well appearing patient in no apparent distress; mood and affect are within normal limits. Left Neck Two millimeter waxy pink crust, probable superficial carcinoma       Right Dorsal Hand, Right Tip of Nose Subtle hornlike 3 mm pink crusts nose and hand    A focused examination was performed including head, neck, arms. Relevant physical exam findings are noted in the Assessment and Plan.   Assessment & Plan    Neoplasm of uncertain behavior of skin Left Neck  Skin / nail biopsy Type of biopsy: tangential   Informed consent: discussed and consent obtained   Timeout: patient name, date of birth, surgical site, and procedure verified   Procedure prep:  Patient was prepped and draped in usual sterile fashion (Non sterile) Prep type:  Chlorhexidine Anesthesia: the lesion was anesthetized in a standard fashion   Anesthetic:  1% lidocaine w/ epinephrine 1-100,000 local infiltration Instrument used: flexible razor blade   Outcome: patient tolerated procedure well   Post-procedure details: wound care instructions given    Destruction of lesion Complexity: simple   Destruction method: electrodesiccation and curettage   Informed consent: discussed and consent obtained   Timeout:  patient name, date of birth, surgical site, and procedure verified Anesthesia: the lesion was anesthetized in a standard fashion   Anesthetic:  1% lidocaine w/ epinephrine 1-100,000 local infiltration Curettage performed in three different directions: Yes    Electrodesiccation performed over the curetted area: Yes   Curettage cycles:  1 Lesion length (cm):  0.7 Lesion width (cm):  0.7 Margin per side (cm):  0 Final wound size (cm):  0.7 Hemostasis achieved with:  aluminum chloride Outcome: patient tolerated procedure well with no complications   Post-procedure details: wound care instructions given    Specimen 1 - Surgical pathology Differential Diagnosis: bcc vs [id number]  Check Margins: No  After shave biopsy the base was curetted and cauterized  AK (actinic keratosis) (2) Right Dorsal Hand; Right Tip of Nose  Destruction of lesion - Right Dorsal Hand, Right Tip of Nose Complexity: simple   Destruction method: cryotherapy   Informed consent: discussed and consent obtained   Timeout:  patient name, date of birth, surgical site, and procedure verified Lesion destroyed using liquid nitrogen: Yes   Cryotherapy cycles:  3 Outcome: patient tolerated procedure well with no complications        I, Lavonna Monarch, MD, have reviewed all documentation for this visit.  The documentation on 10/27/21 for the exam, diagnosis, procedures, and orders are all accurate and complete.

## 2022-01-13 ENCOUNTER — Ambulatory Visit (INDEPENDENT_AMBULATORY_CARE_PROVIDER_SITE_OTHER): Payer: BC Managed Care – PPO | Admitting: Dermatology

## 2022-01-13 ENCOUNTER — Encounter: Payer: Self-pay | Admitting: Dermatology

## 2022-01-13 ENCOUNTER — Other Ambulatory Visit: Payer: Self-pay

## 2022-01-13 DIAGNOSIS — L570 Actinic keratosis: Secondary | ICD-10-CM | POA: Diagnosis not present

## 2022-01-13 DIAGNOSIS — Z86007 Personal history of in-situ neoplasm of skin: Secondary | ICD-10-CM | POA: Diagnosis not present

## 2022-01-13 DIAGNOSIS — L918 Other hypertrophic disorders of the skin: Secondary | ICD-10-CM

## 2022-01-13 DIAGNOSIS — D1801 Hemangioma of skin and subcutaneous tissue: Secondary | ICD-10-CM

## 2022-01-13 DIAGNOSIS — D0439 Carcinoma in situ of skin of other parts of face: Secondary | ICD-10-CM | POA: Diagnosis not present

## 2022-01-13 DIAGNOSIS — C4492 Squamous cell carcinoma of skin, unspecified: Secondary | ICD-10-CM

## 2022-01-13 DIAGNOSIS — D485 Neoplasm of uncertain behavior of skin: Secondary | ICD-10-CM

## 2022-01-13 DIAGNOSIS — Z1283 Encounter for screening for malignant neoplasm of skin: Secondary | ICD-10-CM

## 2022-01-13 HISTORY — DX: Squamous cell carcinoma of skin, unspecified: C44.92

## 2022-01-13 NOTE — Patient Instructions (Signed)

## 2022-01-20 ENCOUNTER — Telehealth: Payer: Self-pay | Admitting: *Deleted

## 2022-01-20 NOTE — Telephone Encounter (Signed)
Left patient a message for pathology results.  ?

## 2022-01-20 NOTE — Telephone Encounter (Signed)
-----   Message from Lavonna Monarch, MD sent at 01/17/2022  5:54 AM EDT ----- ?Schedule surgery with Dr. Darene Lamer ?

## 2022-01-24 ENCOUNTER — Encounter: Payer: Self-pay | Admitting: Dermatology

## 2022-01-27 ENCOUNTER — Telehealth: Payer: Self-pay | Admitting: *Deleted

## 2022-01-27 NOTE — Telephone Encounter (Signed)
-----   Message from Lavonna Monarch, MD sent at 01/17/2022  5:54 AM EDT ----- ?Schedule surgery with Dr. Darene Lamer ?

## 2022-01-27 NOTE — Telephone Encounter (Signed)
Left message for patient to return our phone call.  ?

## 2022-03-10 ENCOUNTER — Encounter: Payer: Self-pay | Admitting: Family Medicine

## 2022-03-10 ENCOUNTER — Ambulatory Visit (INDEPENDENT_AMBULATORY_CARE_PROVIDER_SITE_OTHER): Payer: BC Managed Care – PPO | Admitting: Family Medicine

## 2022-03-10 VITALS — BP 120/78 | HR 94 | Temp 98.7°F | Ht 70.0 in | Wt 193.4 lb

## 2022-03-10 DIAGNOSIS — F411 Generalized anxiety disorder: Secondary | ICD-10-CM

## 2022-03-10 DIAGNOSIS — I10 Essential (primary) hypertension: Secondary | ICD-10-CM | POA: Diagnosis not present

## 2022-03-10 DIAGNOSIS — F17200 Nicotine dependence, unspecified, uncomplicated: Secondary | ICD-10-CM

## 2022-03-10 DIAGNOSIS — E785 Hyperlipidemia, unspecified: Secondary | ICD-10-CM | POA: Diagnosis not present

## 2022-03-10 MED ORDER — BUPROPION HCL ER (XL) 150 MG PO TB24: 150.0000 mg | ORAL_TABLET | Freq: Every day | ORAL | 3 refills | Status: DC

## 2022-03-10 NOTE — Progress Notes (Signed)
?Phone [phone] ?In person visit ?  ?Subjective:  ? ?Shane Matthews is a 46 y.o. year old very pleasant male patient who presents for/with See problem oriented charting ?Chief Complaint  ?Patient presents with  ? Follow-up  ? Hypertension  ? Anxiety  ? ?Past Medical History-  ?Patient Active Problem List  ? Diagnosis Date Noted  ? Current smoker 11/13/2016  ?  Priority: High  ? OSA (obstructive sleep apnea) 03/27/2017  ?  Priority: Medium   ? Hyperlipidemia 10/24/2014  ?  Priority: Medium   ? Generalized anxiety disorder 10/24/2014  ?  Priority: Medium   ? Essential hypertension 10/24/2014  ?  Priority: Medium   ? ? ?Medications- reviewed and updated ?Current Outpatient Medications  ?Medication Sig Dispense Refill  ? buPROPion (WELLBUTRIN XL) 150 MG 24 hr tablet Take 1 tablet (150 mg total) by mouth daily. 90 tablet 3  ? clonazePAM (KLONOPIN) 0.5 MG tablet TAKE 1 TABLET BY MOUTH EVERY DAY AS NEEDED FOR ANXIETY 30 tablet 5  ? lisinopril-hydrochlorothiazide (ZESTORETIC) 20-12.5 MG tablet Take 1 tablet by mouth daily. 90 tablet 3  ? Multiple Vitamin (MULTIVITAMIN) tablet Take 1 tablet by mouth daily.    ? Omega-3 Fatty Acids (FISH OIL) 1200 MG CAPS     ? ?No current facility-administered medications for this visit.  ? ?  ?Objective:  ?BP 120/78   Pulse 94   Temp 98.7 ?F (37.1 ?C)   Ht 5' 10"  (1.778 m)   Wt 193 lb 6.4 oz (87.7 kg)   SpO2 96%   BMI 27.75 kg/m?  ?Gen: NAD, resting comfortably ?CV: RRR no murmurs rubs or gallops ?Lungs: CTAB no crackles, wheeze, rhonchi ?Ext: no edema ?Skin: warm, dry ?  ? ?Assessment and Plan  ? ?#hypertension ?S: medication: Zestoretic 20-12.5Mg  ?BP Readings from Last 3 Encounters:  ?03/10/22 120/78  ?09/09/21 124/80  ?03/06/21 136/86  ? A/P: Excellent control-continue current medication ? ?#hyperlipidemia-LDL over 100 and triglycerides over 400 at times ?S: Medication: Omega 3 1000Mg ?-Coronary artery calcium score 10/02/2021 with score of 0 ?Lab Results  ?Component Value  Date  ? CHOL 213 (H) 09/09/2021  ? HDL 41.10 09/09/2021  ? LDLCALC 139 (H) 09/05/2020  ? LDLDIRECT 107.0 09/09/2021  ? TRIG (H) 09/09/2021  ?  423.0 Triglyceride is over 400; calculations on Lipids are invalid.  ? CHOLHDL 5 09/09/2021  ? A/P: Lipids have been elevated but CT cardiac scoring reassuring November 2022-we have opted to focus on healthy eating/regular exercise/weight loss-congratulated him on another 3 pound weight loss today  ? ? ?# GAD ?S:Medication: Klonopin 0.5Mg mostly at night to help with sleep- using less lately.  Does not drink on nights when he takes clonazepam ?-wants to retrial wellbutrin- doesn't think sleep was different when came off ?  ?Counseling: Has not been beneficial for him in the past ?Prior to: Cymbalta, Lexapro, Paxil, Zoloft, Remeron, doxepin without benefit ? ?  03/06/2021  ?  2:25 PM  ?GAD 7 : Generalized Anxiety Score  ?Nervous, Anxious, on Edge 0  ?Control/stop worrying 1  ?Worry too much - different things 1  ?Trouble relaxing 1  ?Restless 1  ?Easily annoyed or irritable 1  ?Afraid - awful might happen 0  ?Total GAD 7 Score 5  ?Anxiety Difficulty Not difficult at all  ? ? ?  09/09/2021  ?  7:58 AM 03/06/2021  ?  2:24 PM 09/05/2020  ?  8:01 AM  ?Depression screen PHQ 2/9  ?Decreased Interest 0 0 0  ?  Down, Depressed, Hopeless 0 0 0  ?PHQ - 2 Score 0 0 0  ?Altered sleeping  2   ?Tired, decreased energy  1   ?Change in appetite  0   ?Feeling bad or failure about yourself   0   ?Trouble concentrating  0   ?Moving slowly or fidgety/restless  0   ?Suicidal thoughts  0   ?PHQ-9 Score  3   ?Difficult doing work/chores  Not difficult at all   ?A/P: reasonable control but hed like to further optimize. reflecting back he does not feel that sleep improved off of wellbutrin (and fiancee thought he did better on this) and did feel like helped some with anxiety/mood variability as well as smoking- he is agreeable to retrial at 150 mg XR. No SI and if those develop he agrees to contact us  immediately.  ? ?#OSA-compliant with CPAP  ? ?#Current smoker-about 10-15 per day- strongly encouraged cessation and Wellbutrin may help  ? ?Recommended follow up: Return for next already scheduled visit or sooner if needed. ?Future Appointments  ?Date Time Provider [place]  ?04/10/2022  3:30 PM Lavonna Monarch, MD [facility]  ?09/10/2022  8:00 AM Marin Olp, MD [facility]  ?09/17/2022  3:15 PM Lavonna Monarch, MD [facility]  ? ?Lab/Order associations: ?  ICD-10-CM   ?1. Essential hypertension  I10   ?  ?2. Generalized anxiety disorder  F41.1   ?  ?3. Hyperlipidemia, unspecified hyperlipidemia type  E78.5   ?  ?4. Current smoker  F17.200   ?  ? ?Meds ordered this encounter  ?Medications  ? buPROPion (WELLBUTRIN XL) 150 MG 24 hr tablet  ?  Sig: Take 1 tablet (150 mg total) by mouth daily.  ?  Dispense:  90 tablet  ?  Refill:  3  ? ?Return precautions advised.  ?Garret Reddish, MD ? ?

## 2022-03-10 NOTE — Patient Instructions (Addendum)
Wants to hold off on Pneumonia vaccine for now.  ? ?Restart Wellbutrin/bupropion in the morning- if worsening anxiety or poor sleep can stop. If any thought sof self harm let us know immediately ? ?Recommended follow up: Return for next already scheduled visit or sooner if needed.  ?

## 2022-04-10 ENCOUNTER — Encounter: Payer: Self-pay | Admitting: Dermatology

## 2022-04-10 ENCOUNTER — Ambulatory Visit (INDEPENDENT_AMBULATORY_CARE_PROVIDER_SITE_OTHER): Payer: BC Managed Care – PPO | Admitting: Dermatology

## 2022-04-10 DIAGNOSIS — D0439 Carcinoma in situ of skin of other parts of face: Secondary | ICD-10-CM | POA: Diagnosis not present

## 2022-04-10 DIAGNOSIS — D0430 Carcinoma in situ of skin of unspecified part of face: Secondary | ICD-10-CM

## 2022-04-10 NOTE — Patient Instructions (Signed)

## 2022-04-20 ENCOUNTER — Other Ambulatory Visit: Payer: Self-pay | Admitting: Family Medicine

## 2022-04-28 ENCOUNTER — Encounter: Payer: Self-pay | Admitting: Dermatology

## 2022-05-31 ENCOUNTER — Other Ambulatory Visit: Payer: Self-pay | Admitting: Family Medicine

## 2022-06-02 ENCOUNTER — Other Ambulatory Visit: Payer: Self-pay | Admitting: Family Medicine

## 2022-06-02 MED ORDER — CLONAZEPAM 0.5 MG PO TABS: ORAL_TABLET | ORAL | 2 refills | Status: DC

## 2022-06-22 NOTE — Progress Notes (Signed)
   Follow-Up Visit   Subjective  Shane Matthews is a 46 y.o. male who presents for the following: Follow-up (Pt unaware why he has this appointment. Possible follow up on left neck. Txpbx. ).  Follow-up carcinoma on neck, several other spots to check. Location:  Duration:  Quality:  Associated Signs/Symptoms: Modifying Factors:  Severity:  Timing: Context:   Objective  Well appearing patient in no apparent distress; mood and affect are within normal limits. Multiple 1 mm smooth red dermal papules  Left Neck No sign residual in situ carcinoma  Head, Mid Frontal Scalp, Right Forearm - Anterior, Right Hand - Posterior Half dozen thicker gritty crusts plus multiple smaller actinic keratoses  Left Anterior Neck, Left Axilla Pedunculated 1 mm papules  Dorsum of Nose Waxy 6 mm crust, rule out superficial carcinoma         All skin waist up examined.   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  Pt is to return in the Fall for [place] vs PDT tx   Destruction of lesion - Mid Frontal Scalp, Right Forearm - Anterior, Right Hand - Posterior Complexity: simple   Destruction method: cryotherapy   Informed consent: discussed and consent obtained   Lesion destroyed using liquid nitrogen: Yes   Cryotherapy cycles:  5 Outcome: patient tolerated procedure well with no complications    Skin tag (2) Left Axilla; Left Anterior Neck  May choose future removal  Neoplasm of uncertain behavior of skin Dorsum of Nose  Skin / nail biopsy Type of biopsy: tangential   Informed consent: discussed and consent obtained   Timeout: patient name, date of birth, surgical site, and procedure verified   Anesthesia: the lesion was anesthetized in a standard fashion   Anesthetic:  1%  lidocaine w/ epinephrine 1-100,000 local infiltration Instrument used: flexible razor blade   Hemostasis achieved with: ferric subsulfate and electrodesiccation   Outcome: patient tolerated procedure well   Post-procedure details: wound care instructions given    Specimen 1 - Surgical pathology Differential Diagnosis: R/O BCC VS SCC  Check Margins: No  Cherry angioma  No intervention necessary      I, Lavonna Monarch, MD, have reviewed all documentation for this visit.  The documentation on 06/22/22 for the exam, diagnosis, procedures, and orders are all accurate and complete.

## 2022-06-22 NOTE — Progress Notes (Signed)
   Follow-Up Visit   Subjective  Shane Matthews is a 46 y.o. male who presents for the following: Follow-up (Pt unaware why he has this appointment. Possible follow up on left neck. Txpbx. ).  Follow-up carcinoma in situ neck, several other spots to check Location:  Duration:  Quality:  Associated Signs/Symptoms: Modifying Factors:  Severity:  Timing: Context:   Objective  Well appearing patient in no apparent distress; mood and affect are within normal limits. Multiple 1 mm smooth red dermal papules  Left Neck No sign residual in situ carcinoma  Head, Mid Frontal Scalp, Right Forearm - Anterior, Right Hand - Posterior Half dozen thicker gritty crusts plus multiple smaller actinic keratoses  Left Anterior Neck, Left Axilla Pedunculated 1 mm papules  Dorsum of Nose Waxy 6 mm crust, rule out superficial carcinoma         All skin waist up examined.   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  Pt is to return in the Fall for [place] vs PDT tx   Destruction of lesion - Mid Frontal Scalp, Right Forearm - Anterior, Right Hand - Posterior Complexity: simple   Destruction method: cryotherapy   Informed consent: discussed and consent obtained   Lesion destroyed using liquid nitrogen: Yes   Cryotherapy cycles:  5 Outcome: patient tolerated procedure well with no complications    Skin tag (2) Left Axilla; Left Anterior Neck  May choose future removal  Neoplasm of uncertain behavior of skin Dorsum of Nose  Skin / nail biopsy Type of biopsy: tangential   Informed consent: discussed and consent obtained   Timeout: patient name, date of birth, surgical site, and procedure verified   Anesthesia: the lesion was anesthetized in a standard fashion   Anesthetic:   1% lidocaine w/ epinephrine 1-100,000 local infiltration Instrument used: flexible razor blade   Hemostasis achieved with: ferric subsulfate and electrodesiccation   Outcome: patient tolerated procedure well   Post-procedure details: wound care instructions given    Specimen 1 - Surgical pathology Differential Diagnosis: R/O BCC VS SCC  Check Margins: No  Cherry angioma  No intervention necessary      I, Lavonna Monarch, MD, have reviewed all documentation for this visit.  The documentation on 06/22/22 for the exam, diagnosis, procedures, and orders are all accurate and complete.

## 2022-07-28 ENCOUNTER — Encounter: Payer: Self-pay | Admitting: *Deleted

## 2022-09-09 DIAGNOSIS — D1801 Hemangioma of skin and subcutaneous tissue: Secondary | ICD-10-CM | POA: Diagnosis not present

## 2022-09-09 DIAGNOSIS — L821 Other seborrheic keratosis: Secondary | ICD-10-CM | POA: Diagnosis not present

## 2022-09-09 DIAGNOSIS — L814 Other melanin hyperpigmentation: Secondary | ICD-10-CM | POA: Diagnosis not present

## 2022-09-09 DIAGNOSIS — L570 Actinic keratosis: Secondary | ICD-10-CM | POA: Diagnosis not present

## 2022-09-10 ENCOUNTER — Ambulatory Visit (INDEPENDENT_AMBULATORY_CARE_PROVIDER_SITE_OTHER): Payer: BC Managed Care – PPO | Admitting: Family Medicine

## 2022-09-10 ENCOUNTER — Encounter: Payer: Self-pay | Admitting: Family Medicine

## 2022-09-10 VITALS — BP 120/80 | HR 86 | Temp 98.0°F | Ht 70.0 in | Wt 191.2 lb

## 2022-09-10 DIAGNOSIS — Z0000 Encounter for general adult medical examination without abnormal findings: Secondary | ICD-10-CM

## 2022-09-10 DIAGNOSIS — E785 Hyperlipidemia, unspecified: Secondary | ICD-10-CM

## 2022-09-10 DIAGNOSIS — F17200 Nicotine dependence, unspecified, uncomplicated: Secondary | ICD-10-CM

## 2022-09-10 LAB — CBC WITH DIFFERENTIAL/PLATELET
Basophils Absolute: 0 10*3/uL (ref 0.0–0.1)
Basophils Relative: 0.6 % (ref 0.0–3.0)
Eosinophils Absolute: 0.2 10*3/uL (ref 0.0–0.7)
Eosinophils Relative: 3.3 % (ref 0.0–5.0)
HCT: 43.6 % (ref 39.0–52.0)
Hemoglobin: 14.8 g/dL (ref 13.0–17.0)
Lymphocytes Relative: 32.8 % (ref 12.0–46.0)
Lymphs Abs: 2.3 10*3/uL (ref 0.7–4.0)
MCHC: 34 g/dL (ref 30.0–36.0)
MCV: 93.4 fl (ref 78.0–100.0)
Monocytes Absolute: 0.7 10*3/uL (ref 0.1–1.0)
Monocytes Relative: 9.9 % (ref 3.0–12.0)
Neutro Abs: 3.7 10*3/uL (ref 1.4–7.7)
Neutrophils Relative %: 53.4 % (ref 43.0–77.0)
Platelets: 277 10*3/uL (ref 150.0–400.0)
RBC: 4.66 Mil/uL (ref 4.22–5.81)
RDW: 12.5 % (ref 11.5–15.5)
WBC: 7 10*3/uL (ref 4.0–10.5)

## 2022-09-10 LAB — COMPREHENSIVE METABOLIC PANEL
ALT: 30 U/L (ref 0–53)
AST: 26 U/L (ref 0–37)
Albumin: 4.9 g/dL (ref 3.5–5.2)
Alkaline Phosphatase: 73 U/L (ref 39–117)
BUN: 14 mg/dL (ref 6–23)
CO2: 28 mEq/L (ref 19–32)
Calcium: 9.8 mg/dL (ref 8.4–10.5)
Chloride: 101 mEq/L (ref 96–112)
Creatinine, Ser: 0.98 mg/dL (ref 0.40–1.50)
GFR: 91.11 mL/min (ref >60.00)
Glucose, Bld: 99 mg/dL (ref 70–99)
Potassium: 4.3 mEq/L (ref 3.5–5.1)
Sodium: 137 mEq/L (ref 135–145)
Total Bilirubin: 0.6 mg/dL (ref 0.2–1.2)
Total Protein: 7.4 g/dL (ref 6.0–8.3)

## 2022-09-10 LAB — URINALYSIS, ROUTINE W REFLEX MICROSCOPIC
Bilirubin Urine: NEGATIVE
Hgb urine dipstick: NEGATIVE
Ketones, ur: NEGATIVE
Leukocytes,Ua: NEGATIVE
Nitrite: NEGATIVE
RBC / HPF: NONE SEEN (ref 0–?)
Specific Gravity, Urine: 1.01 (ref 1.000–1.030)
Total Protein, Urine: NEGATIVE
Urine Glucose: NEGATIVE
Urobilinogen, UA: 0.2 (ref 0.0–1.0)
pH: 7.5 (ref 5.0–8.0)

## 2022-09-10 LAB — LIPID PANEL
Cholesterol: 209 mg/dL — ABNORMAL HIGH (ref 0–200)
HDL: 40.9 mg/dL (ref >39.00)
NonHDL: 167.66
Total CHOL/HDL Ratio: 5
Triglycerides: 329 mg/dL — ABNORMAL HIGH (ref 0.0–149.0)
VLDL: 65.8 mg/dL — ABNORMAL HIGH (ref 0.0–40.0)

## 2022-09-10 LAB — LDL CHOLESTEROL, DIRECT: Direct LDL: 115 mg/dL

## 2022-09-10 NOTE — Patient Instructions (Addendum)
Please stop by lab before you go If you have mychart- we will send your results within 3 business days of [place] receiving them.  If you do not have mychart- we will call you about results within 5 business days of [place] receiving them.  *please also note that you will see labs on mychart as soon as they post. I will later go in and write notes on them- will say "notes from Dr. Yong Channel"   Can call back for nurse visit for flu shot or you can get at a pharmacy and let us know date  Recommended follow up: Return in about 6 months (around 03/11/2023) for followup or sooner if needed.Schedule b4 you leave.

## 2022-09-10 NOTE — Progress Notes (Signed)
Phone: [phone]    Subjective:  Patient presents today for their annual physical. Chief complaint-noted.   See problem oriented charting- ROS- full  review of systems was completed and negative  Per full ROS sheet completed by patient  The following were reviewed and entered/updated in epic: Past Medical History:  Diagnosis Date   Anxiety    Chicken pox    GERD (gastroesophageal reflux disease)    sporadic   HTN (hypertension)    Insomnia    Squamous cell carcinoma of skin 01/13/2022   dorsum tip of the nose scc insitu   Patient Active Problem List   Diagnosis Date Noted   Current smoker 11/13/2016    Priority: High   OSA (obstructive sleep apnea) 03/27/2017    Priority: Medium    Hyperlipidemia 10/24/2014    Priority: Medium    Generalized anxiety disorder 10/24/2014    Priority: Medium    Essential hypertension 10/24/2014    Priority: Medium    Past Surgical History:  Procedure Laterality Date   WISDOM TOOTH EXTRACTION      Family History  Problem Relation Age of Onset   Cancer Mother    Stroke Mother        39 at death   Diabetes Mother    Hypertension Mother    Cancer Father    Stroke Father        54 at death   Lung cancer Father        long term smoker   Skin cancer Father    Healthy Sister        66 years older than him   Healthy Brother     Medications- reviewed and updated Current Outpatient Medications  Medication Sig Dispense Refill   buPROPion (WELLBUTRIN XL) 150 MG 24 hr tablet Take 1 tablet (150 mg total) by mouth daily. 90 tablet 3   clonazePAM (KLONOPIN) 0.5 MG tablet TAKE 1 TABLET BY MOUTH EVERY DAY AS NEEDED FOR ANXIETY Strength: 0.5 mg 30 tablet 2   lisinopril-hydrochlorothiazide (ZESTORETIC) 20-12.5 MG tablet Take 1 tablet by mouth daily. 90 tablet 3   Multiple Vitamin (MULTIVITAMIN) tablet Take 1 tablet by mouth daily.     Omega-3 Fatty Acids (FISH OIL) 1200 MG CAPS      No current facility-administered medications for this  visit.    Allergies-reviewed and updated No Known Allergies  Social History   Social History Narrative   Divorced. With fiancee since 2015 (long term plan). 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 Fiancee with [employer]. 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    HS degree      Hobbies: golf, fish, travel-beach, [place], enjoys all sports particular hockey and [employer] heels      Objective:  BP 120/80   Pulse 86   Temp 98 F (36.7 C)   Ht '5\' 10"'$  (1.778 m)   Wt 191 lb 3.2 oz (86.7 kg)   SpO2 96%   BMI 27.43 kg/m  Gen: NAD, resting comfortably HEENT: Mucous membranes are moist. Oropharynx normal Neck: no thyromegaly CV: RRR no murmurs rubs or gallops Lungs: CTAB no crackles, wheeze, rhonchi Abdomen: soft/nontender/nondistended/normal bowel sounds. No rebound or guarding.  Ext: no edema Skin: warm, dry Neuro: grossly normal, moves all extremities, PERRLA    Assessment and Plan:  46 y.o. male presenting for annual physical.  Health Maintenance counseling: 1. Anticipatory guidance: Patient counseled regarding regular  dental exams -q6 months, eye exams - yearly,  avoiding smoking and second hand smoke-see below , limiting alcohol to 2 beverages per day- staying under 14- had been up to 20 in the past, no illicit drugs.   2. Risk factor reduction:  Advised patient of need for regular exercise and diet rich and fruits and vegetables to reduce risk of heart attack and stroke.  Exercise- still getting 7-8 k steps most days. Walking with fiancee some.  Diet/weight management-down another 2 pounds from last visit-ongoing steady weight loss-congratulated efforts- ups and downs on quality of diet.  Wt Readings from Last 3 Encounters:  09/10/22 191 lb 3.2 oz (86.7 kg)  03/10/22 193 lb 6.4 oz (87.7 kg)  09/09/21 196 lb 9.6 oz (89.2 kg)  3. Immunizations/screenings/ancillary studies- declines flu shot  and covid shot  Immunization History  Administered Date(s) Administered   Influenza,inj,Quad PF,6+ Mos 08/30/2019, 09/05/2020, 09/09/2021   Influenza,trivalent, recombinat, inj, PF 08/29/2009, 09/03/2010, 09/06/2012   Influenza-Unspecified 09/03/2014, 08/04/2015, 09/17/2016, 08/21/2017, 07/27/2018   Moderna Sars-Covid-2 Vaccination 06/19/2020, 07/17/2020   Tdap 11/03/2008, 08/29/2009, 02/28/2020  4. Prostate cancer screening-   no family history, start at age 47   5. Colon cancer screening - normal Cologuard 02/27/2020-will be due at next visit 6. Skin cancer screening/prevention-has seen dermatology in the past-dad with skin cancer history- saw yesterday with some cryotherapy - [facility]. advised regular sunscreen use. Denies worrisome, changing, or new skin lesions.  7. Testicular cancer screening- advised monthly self exams  8. STD screening- patient opts  out as monogamous 9. Smoking associated screening-current smoker-we will get urinalysis.  Encouraged cessation-still smoking about 6-9 per day  -AAA screening at 63 planned - Did not tolerate Chantix in the past, wellbutrin has not fully cut down desire   Status of chronic or acute concerns   #hypertension S: medication: Zestoretic 20-12.'5Mg'$  BP Readings from Last 3 Encounters:  09/10/22 120/80  03/10/22 120/78  09/09/21 124/80   A/P: Controlled. Continue current medications.    #hyperlipidemia-LDL over 100 and triglycerides over 400 at times S: Medication: Omega 3 '1000Mg'$  -Coronary artery calcium score 10/02/2021 with score of 0 Lab Results  Component Value Date   CHOL 213 (H) 09/09/2021   HDL 41.10 09/09/2021   LDLCALC 139 (H) 09/05/2020   LDLDIRECT 107.0 09/09/2021   TRIG (H) 09/09/2021    423.0 Triglyceride is over 400; calculations on Lipids are invalid.   CHOLHDL 5 09/09/2021   A/P: Cholesterol is not ideally controlled but with reassuring CT calcium scoring-continue to work on lifestyle and update lipid  panel today-unlikely to add medication  # GAD S:Medication: wellbutrin '150mg'$  XR (feels mood improved and less anxiety),  Klonopin 0.'5Mg'$  mostly at night to help with sleep- maybe 3 a week Counseling: Has not been beneficial for him in the past No SI Prior to: Cymbalta, Lexapro, Paxil, Zoloft, Remeron, doxepin without benefit A/P: Reasonable control-continue current medication  #OSA-compliant with CPAP   Recommended follow up: Return in about 6 months (around 03/11/2023) for followup or sooner if needed.Schedule b4 you leave.  Lab/Order associations: fasting   ICD-10-CM   1. Preventative health care  Z00.00     2. Current smoker  F17.200     3. Hyperlipidemia, unspecified hyperlipidemia type  E78.5       No orders of the defined types were placed in this encounter.   Return precautions advised.   Garret Reddish, MD

## 2022-09-17 ENCOUNTER — Ambulatory Visit: Payer: BC Managed Care – PPO | Admitting: Dermatology

## 2022-10-30 ENCOUNTER — Other Ambulatory Visit: Payer: Self-pay | Admitting: Family Medicine

## 2022-11-24 ENCOUNTER — Other Ambulatory Visit: Payer: Self-pay | Admitting: Family Medicine

## 2023-02-03 DIAGNOSIS — Z789 Other specified health status: Secondary | ICD-10-CM | POA: Diagnosis not present

## 2023-02-03 DIAGNOSIS — L298 Other pruritus: Secondary | ICD-10-CM | POA: Diagnosis not present

## 2023-02-03 DIAGNOSIS — L820 Inflamed seborrheic keratosis: Secondary | ICD-10-CM | POA: Diagnosis not present

## 2023-02-03 DIAGNOSIS — L538 Other specified erythematous conditions: Secondary | ICD-10-CM | POA: Diagnosis not present

## 2023-03-09 DIAGNOSIS — Z789 Other specified health status: Secondary | ICD-10-CM | POA: Diagnosis not present

## 2023-03-09 DIAGNOSIS — D485 Neoplasm of uncertain behavior of skin: Secondary | ICD-10-CM | POA: Diagnosis not present

## 2023-03-09 DIAGNOSIS — D1801 Hemangioma of skin and subcutaneous tissue: Secondary | ICD-10-CM | POA: Diagnosis not present

## 2023-03-09 DIAGNOSIS — L570 Actinic keratosis: Secondary | ICD-10-CM | POA: Diagnosis not present

## 2023-03-09 DIAGNOSIS — L821 Other seborrheic keratosis: Secondary | ICD-10-CM | POA: Diagnosis not present

## 2023-03-09 DIAGNOSIS — L820 Inflamed seborrheic keratosis: Secondary | ICD-10-CM | POA: Diagnosis not present

## 2023-03-09 DIAGNOSIS — L538 Other specified erythematous conditions: Secondary | ICD-10-CM | POA: Diagnosis not present

## 2023-03-09 DIAGNOSIS — L298 Other pruritus: Secondary | ICD-10-CM | POA: Diagnosis not present

## 2023-03-09 DIAGNOSIS — L814 Other melanin hyperpigmentation: Secondary | ICD-10-CM | POA: Diagnosis not present

## 2023-03-12 ENCOUNTER — Ambulatory Visit: Payer: BC Managed Care – PPO | Admitting: Family Medicine

## 2023-03-15 IMAGING — CT CT CARDIAC CORONARY ARTERY CALCIUM SCORE
3 series · 14 of 20 positions shown, 16 images · non-contrast
Comparison: 01/30/2015 chest radiograph
COMPARISON: 01/30/2015 chest radiograph

Addendum:
EXAM:
OVER-READ INTERPRETATION  CT CHEST

The following report is an over-read performed by radiologist Dr.
over-read does not include interpretation of cardiac or coronary
anatomy or pathology. The calcium score interpretation by the
cardiologist is attached.
CLINICAL DATA: Cardiovascular Disease Risk stratification
Coronary Calcium Score
TECHNIQUE: A gated, non-contrast computed tomography scan of the heart was
performed using 3mm slice thickness. Axial images were analyzed on a
dedicated workstation. Calcium scoring of the coronary arteries was
performed using the Agatston method.

[Series 2: cascseq 2.0 sa36 (id) (id) · axial · 0.39mm/px · z∈[-358,-268]mm · 4 of 76 slices shown]
[im 16/76  vessel]
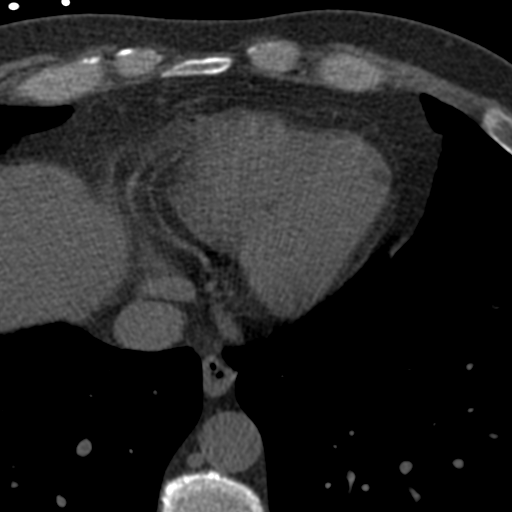
[im 31/76  vessel]
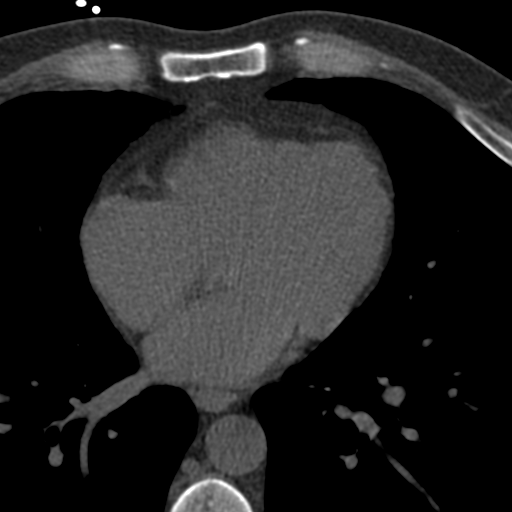
[im 46/76  vessel]
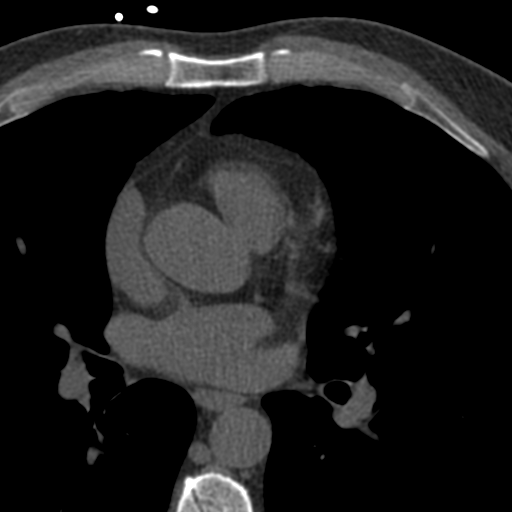
[im 61/76  vessel]
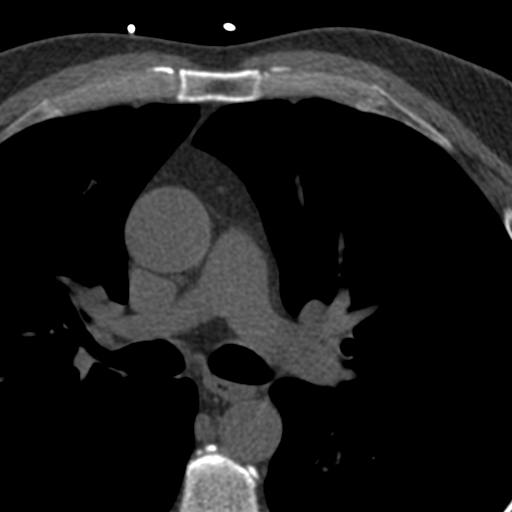

[Series 3: cascseq 2.0 bf37 st · axial · 0.78mm/px · z∈[-364,-264]mm · 5 of 76 slices shown, 7 images]
[im 13/76  vessel]
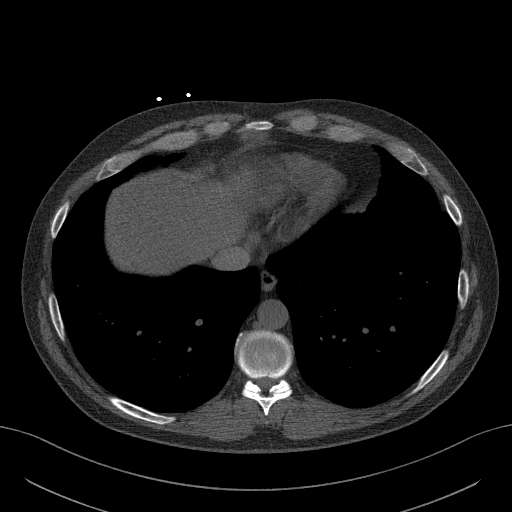
[im 13/76  lung]
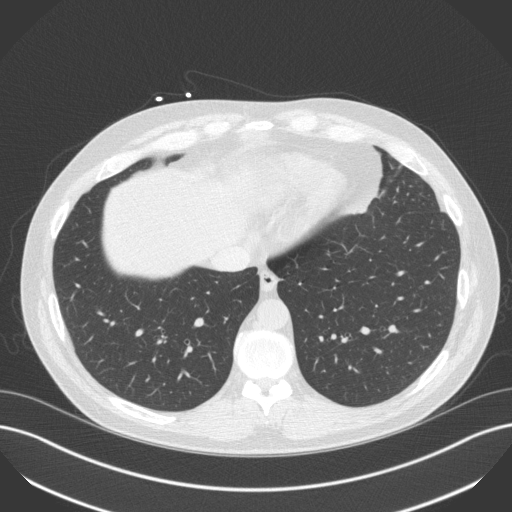
[im 26/76  vessel]
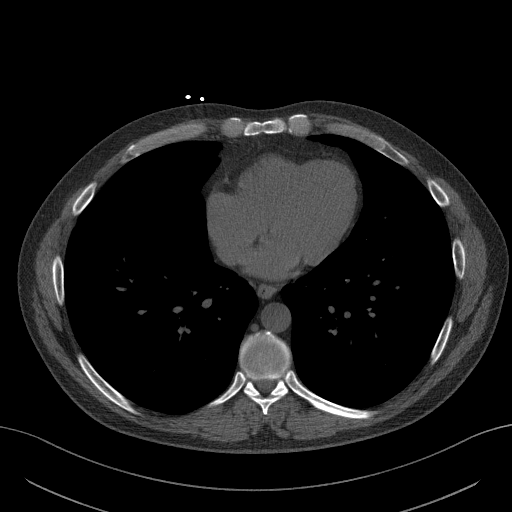
[im 38/76  vessel]
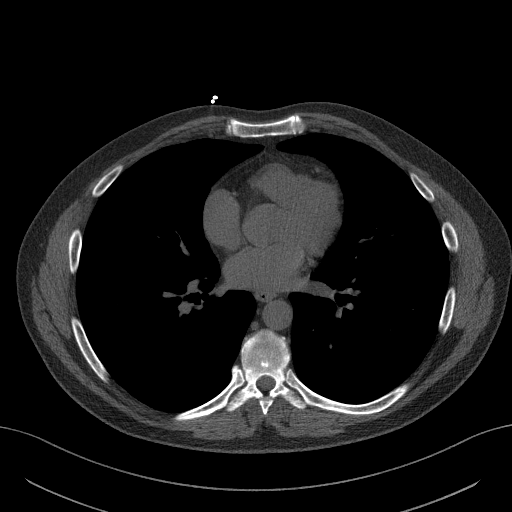
[im 51/76  vessel]
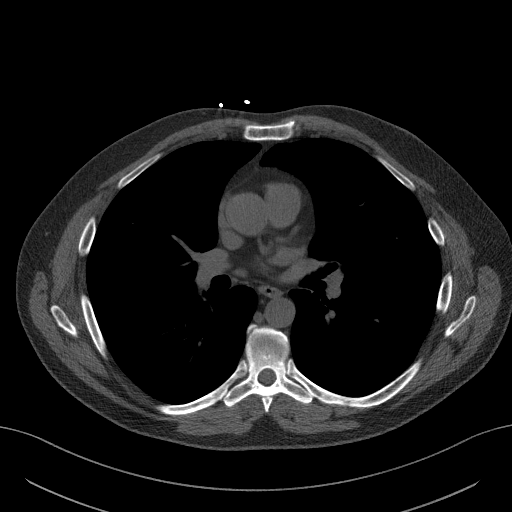
[im 63/76  vessel]
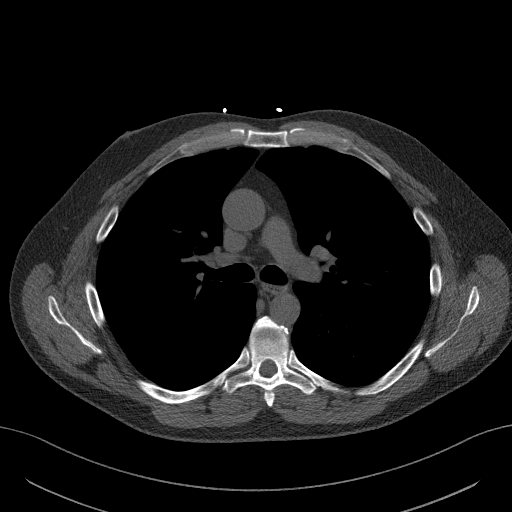
[im 63/76  lung]
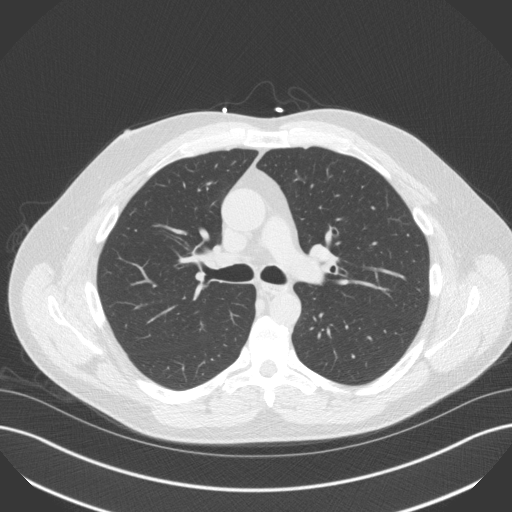

[Series 4: cascseq 2.0 br59 lung · axial · 0.78mm/px · z∈[-364,-264]mm · 5 of 76 slices shown]
[im 13/76  lung]
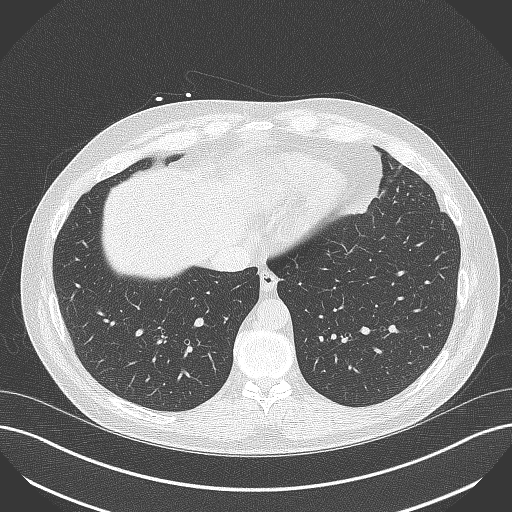
[im 26/76  lung]
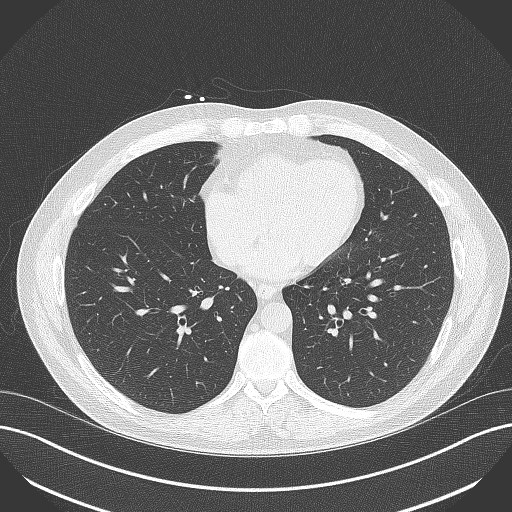
[im 38/76  lung]
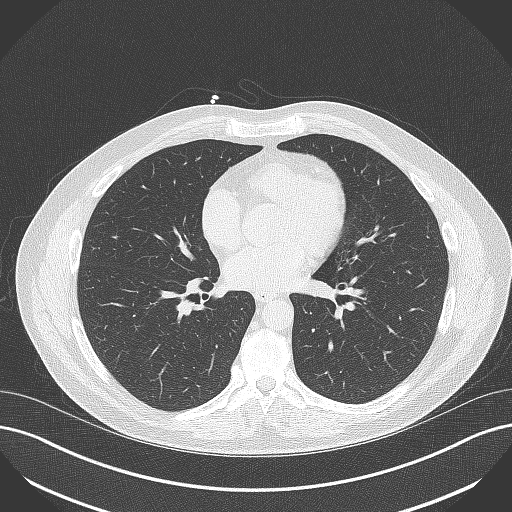
[im 51/76  lung]
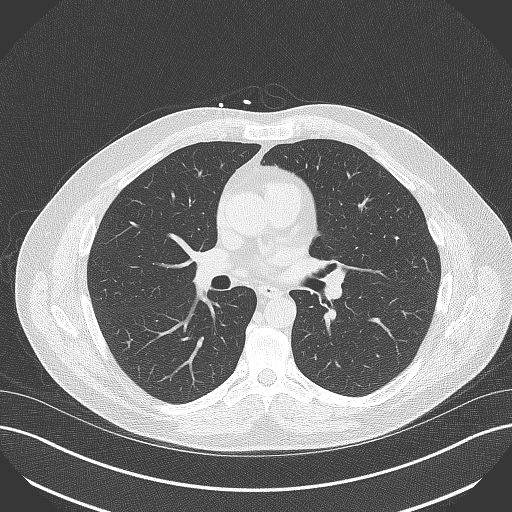
[im 63/76  lung]
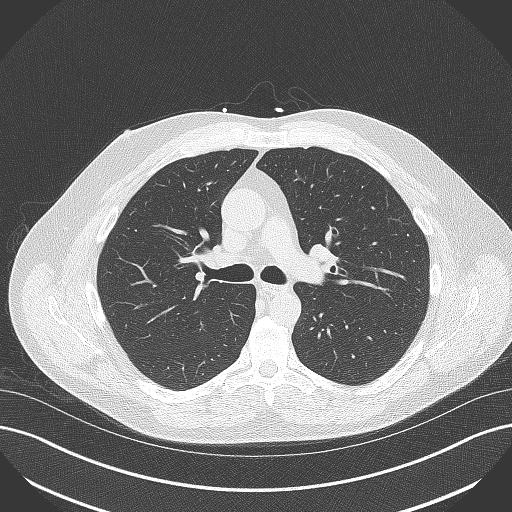

[14 of 20 positions shown; findings below may reference images not displayed]

FINDINGS: Vascular: Normal aortic caliber.

Mediastinum/Nodes: No imaged thoracic adenopathy.

Lungs/Pleura: No pleural fluid.  Clear imaged lungs.

Upper Abdomen: Mild hepatic steatosis. Normal imaged portions of the
stomach.

Musculoskeletal: No acute osseous abnormality.
IMPRESSION: No acute findings in the imaged extracardiac chest.

Hepatic steatosis.
FINDINGS: Coronary arteries: Normal origins.

Coronary Calcium Score:

Left main: 0

Left anterior descending artery: 0

Left circumflex artery: 0

Right coronary artery: 0

Total: 0

Pericardium: Normal.

Ascending Aorta: Normal caliber.

Non-cardiac: See separate report from [REDACTED].
IMPRESSION: Coronary calcium score of 0 Agatston units. This was suggests low
risk for future cardiac events.



If CAC=0, it is reasonable to withhold statin therapy and reassess
in 5 to 10 years, as long as higher risk conditions are absent
(diabetes mellitus, family history of premature CHD in first degree
relatives (males <55 years; females <65 years), cigarette smoking,
or LDL >=190 mg/dL).

If CAC is 1 to 99, it is reasonable to initiate statin therapy for
patients >=55 years of age.

If CAC is >=100 or >=75th percentile, it is reasonable to initiate
statin therapy at any age.

Cardiology referral should be considered for patients with CAC
scores >=400 or >=75th percentile.

*9177 AHA/ACC/AACVPR/AAPA/ABC/TENG/NOMASIBULELE/LONDO/[place]enbaum/TIGER/MATSUMOTO/TATIAVA
Guideline on the Management of Blood Cholesterol: A Report of the
American College of Cardiology/American Heart Association Task Force
on Clinical Practice Guidelines. J Am Coll Cardiol.
1504;73(24):9286-9617.

*** End of Addendum ***
EXAM:
OVER-READ INTERPRETATION  CT CHEST

The following report is an over-read performed by radiologist Dr.
over-read does not include interpretation of cardiac or coronary
anatomy or pathology. The calcium score interpretation by the
cardiologist is attached.
FINDINGS: Vascular: Normal aortic caliber.

Mediastinum/Nodes: No imaged thoracic adenopathy.

Lungs/Pleura: No pleural fluid.  Clear imaged lungs.

Upper Abdomen: Mild hepatic steatosis. Normal imaged portions of the
stomach.

Musculoskeletal: No acute osseous abnormality.
IMPRESSION: No acute findings in the imaged extracardiac chest.

Hepatic steatosis.

## 2023-04-10 ENCOUNTER — Other Ambulatory Visit: Payer: Self-pay | Admitting: Family Medicine

## 2023-04-20 ENCOUNTER — Encounter: Payer: Self-pay | Admitting: Family Medicine

## 2023-04-20 ENCOUNTER — Ambulatory Visit (INDEPENDENT_AMBULATORY_CARE_PROVIDER_SITE_OTHER): Payer: BC Managed Care – PPO | Admitting: Family Medicine

## 2023-04-20 VITALS — BP 112/74 | HR 70 | Temp 97.3°F | Ht 70.0 in | Wt 193.8 lb

## 2023-04-20 DIAGNOSIS — Z1211 Encounter for screening for malignant neoplasm of colon: Secondary | ICD-10-CM

## 2023-04-20 DIAGNOSIS — F17200 Nicotine dependence, unspecified, uncomplicated: Secondary | ICD-10-CM | POA: Diagnosis not present

## 2023-04-20 DIAGNOSIS — E785 Hyperlipidemia, unspecified: Secondary | ICD-10-CM

## 2023-04-20 DIAGNOSIS — F411 Generalized anxiety disorder: Secondary | ICD-10-CM

## 2023-04-20 DIAGNOSIS — I10 Essential (primary) hypertension: Secondary | ICD-10-CM

## 2023-04-20 MED ORDER — LISINOPRIL-HYDROCHLOROTHIAZIDE 20-12.5 MG PO TABS: 1.0000 | ORAL_TABLET | Freq: Every day | ORAL | 3 refills | Status: DC

## 2023-04-20 MED ORDER — CLONAZEPAM 0.5 MG PO TABS: ORAL_TABLET | ORAL | 5 refills | Status: DC

## 2023-04-20 NOTE — Patient Instructions (Addendum)
Health Maintenance Due  Topic Date Due   Fecal DNA (Cologuard)  02/15/2023  please let us know if you have not received your Cologuard within 3 weeks-please complete after you receive  Encourage mild weight loss- consider myfitnesspal, weight watchers, intermittent fasting  Recommended follow up: Return for next already scheduled visit or sooner if needed.

## 2023-04-20 NOTE — Progress Notes (Signed)
Phone [phone] In person visit   Subjective:   Shane Matthews is a 47 y.o. year old very pleasant male patient who presents for/with See problem oriented charting Chief Complaint  Patient presents with   Medical Management of Chronic Issues   Hypertension    Past Medical History-  Patient Active Problem List   Diagnosis Date Noted   Current smoker 11/13/2016    Priority: High   OSA (obstructive sleep apnea) 03/27/2017    Priority: Medium    Hyperlipidemia 10/24/2014    Priority: Medium    Generalized anxiety disorder 10/24/2014    Priority: Medium    Essential hypertension 10/24/2014    Priority: Medium     Medications- reviewed and updated Current Outpatient Medications  Medication Sig Dispense Refill   Multiple Vitamin (MULTIVITAMIN) tablet Take 1 tablet by mouth daily.     Omega-3 Fatty Acids (FISH OIL) 1200 MG CAPS      clonazePAM (KLONOPIN) 0.5 MG tablet TAKE 1 TABLET BY MOUTH EVERY DAY AS NEEDED FOR ANXIETY 30 tablet 5   lisinopril-hydrochlorothiazide (ZESTORETIC) 20-12.5 MG tablet Take 1 tablet by mouth daily. 90 tablet 3   No current facility-administered medications for this visit.     Objective:  BP 112/74   Pulse 70   Temp (!) 97.3 F (36.3 C)   Ht 5\' 10"  (1.778 m)   Wt 193 lb 12.8 oz (87.9 kg)   SpO2 97%   BMI 27.81 kg/m  Gen: NAD, resting comfortably CV: RRR no murmurs rubs or gallops Lungs: CTAB no crackles, wheeze, rhonchi Ext: no edema Skin: warm, dry    Assessment and Plan   #hypertension S: medication: Zestoretic 20-12.5Mg  BP Readings from Last 3 Encounters:  04/20/23 112/74  09/10/22 120/80  03/10/22 120/78  A/P: stable- continue current medicines   #hyperlipidemia-LDL over 100 and triglycerides over 400 at times S: Medication: Omega 3 1000Mg  -Coronary artery calcium score 10/02/2021 with score of 0 - weight roughly stable- no major changes. Trying to be active at least- walking 3-4 days a week. Still lots of steps at work  as well.  Lab Results  Component Value Date   CHOL 209 (H) 09/10/2022   HDL 40.90 09/10/2022   LDLCALC 139 (H) 09/05/2020   LDLDIRECT 115.0 09/10/2022   TRIG 329.0 (H) 09/10/2022   CHOLHDL 5 09/10/2022  A/P: lipids above goal but wants to work on lifestyle over medications especially with reassuring CT calcium scoring- check lipids next visit  # GAD S:Medication: wellbutrin 150mg  XR in past but stopped and sleeped improved,  Klonopin 0.5Mg  mostly at night to help with sleep Counseling: Has not been beneficial for him in the past Prior to: Cymbalta, Lexapro, Paxil, Zoloft, Remeron, doxepin without benefit - reasonable control of anxiety but comes and goes- no worse off wellbutrin A/P: reasonable control- continue current medications- main issue right now is insomnia and clonazepam very helpful- discussed some risks with dementia   #OSA-compliant with CPAP   #Current smoker-still about 6-9 per day- can go a day or two without but then goes back to it.   #health maintenance- due for cologuard- ordered today   Recommended follow up: Return for next already scheduled visit or sooner if needed. Future Appointments  Date Time Provider Department Center  09/18/2023  8:00 AM Shelva Majestic, MD [facility]   Lab/Order associations:   ICD-10-CM   1. Essential hypertension  I10     2. Hyperlipidemia, unspecified hyperlipidemia type  E78.5     3.  Generalized anxiety disorder  F41.1     4. Current smoker  F17.200     5. Screen for colon cancer  Z12.11 Cologuard      Meds ordered this encounter  Medications   clonazePAM (KLONOPIN) 0.5 MG tablet    Sig: TAKE 1 TABLET BY MOUTH EVERY DAY AS NEEDED FOR ANXIETY    Dispense:  30 tablet    Refill:  5   lisinopril-hydrochlorothiazide (ZESTORETIC) 20-12.5 MG tablet    Sig: Take 1 tablet by mouth daily.    Dispense:  90 tablet    Refill:  3    Return precautions advised.  Tana Conch, MD

## 2023-05-05 DIAGNOSIS — Z1211 Encounter for screening for malignant neoplasm of colon: Secondary | ICD-10-CM | POA: Diagnosis not present

## 2023-05-14 LAB — COLOGUARD: COLOGUARD: NEGATIVE

## 2023-05-19 DIAGNOSIS — C44222 Squamous cell carcinoma of skin of right ear and external auricular canal: Secondary | ICD-10-CM | POA: Diagnosis not present

## 2023-05-19 DIAGNOSIS — D485 Neoplasm of uncertain behavior of skin: Secondary | ICD-10-CM | POA: Diagnosis not present

## 2023-06-03 DIAGNOSIS — C44212 Basal cell carcinoma of skin of right ear and external auricular canal: Secondary | ICD-10-CM | POA: Diagnosis not present

## 2023-09-09 ENCOUNTER — Other Ambulatory Visit: Payer: Self-pay | Admitting: Family Medicine

## 2023-09-18 ENCOUNTER — Ambulatory Visit (INDEPENDENT_AMBULATORY_CARE_PROVIDER_SITE_OTHER): Payer: BC Managed Care – PPO | Admitting: Family Medicine

## 2023-09-18 ENCOUNTER — Encounter: Payer: Self-pay | Admitting: Family Medicine

## 2023-09-18 VITALS — BP 122/78 | HR 92 | Temp 97.8°F | Ht 70.0 in | Wt 187.6 lb

## 2023-09-18 DIAGNOSIS — I10 Essential (primary) hypertension: Secondary | ICD-10-CM | POA: Diagnosis not present

## 2023-09-18 DIAGNOSIS — Z0000 Encounter for general adult medical examination without abnormal findings: Secondary | ICD-10-CM | POA: Diagnosis not present

## 2023-09-18 DIAGNOSIS — F17200 Nicotine dependence, unspecified, uncomplicated: Secondary | ICD-10-CM

## 2023-09-18 DIAGNOSIS — E785 Hyperlipidemia, unspecified: Secondary | ICD-10-CM | POA: Diagnosis not present

## 2023-09-18 DIAGNOSIS — F411 Generalized anxiety disorder: Secondary | ICD-10-CM

## 2023-09-18 LAB — CBC WITH DIFFERENTIAL/PLATELET
Basophils Absolute: 0.1 10*3/uL (ref 0.0–0.1)
Basophils Relative: 0.8 % (ref 0.0–3.0)
Eosinophils Absolute: 0.1 10*3/uL (ref 0.0–0.7)
Eosinophils Relative: 1.8 % (ref 0.0–5.0)
HCT: 45.3 % (ref 39.0–52.0)
Hemoglobin: 15.3 g/dL (ref 13.0–17.0)
Lymphocytes Relative: 34.3 % (ref 12.0–46.0)
Lymphs Abs: 2.6 10*3/uL (ref 0.7–4.0)
MCHC: 33.9 g/dL (ref 30.0–36.0)
MCV: 94 fL (ref 78.0–100.0)
Monocytes Absolute: 0.6 10*3/uL (ref 0.1–1.0)
Monocytes Relative: 7.9 % (ref 3.0–12.0)
Neutro Abs: 4.3 10*3/uL (ref 1.4–7.7)
Neutrophils Relative %: 55.2 % (ref 43.0–77.0)
Platelets: 281 10*3/uL (ref 150.0–400.0)
RBC: 4.82 Mil/uL (ref 4.22–5.81)
RDW: 12.9 % (ref 11.5–15.5)
WBC: 7.7 10*3/uL (ref 4.0–10.5)

## 2023-09-18 LAB — URINALYSIS, ROUTINE W REFLEX MICROSCOPIC
Bilirubin Urine: NEGATIVE
Hgb urine dipstick: NEGATIVE
Ketones, ur: NEGATIVE
Leukocytes,Ua: NEGATIVE
Nitrite: NEGATIVE
RBC / HPF: NONE SEEN (ref 0–?)
Specific Gravity, Urine: 1.015 (ref 1.000–1.030)
Total Protein, Urine: NEGATIVE
Urine Glucose: NEGATIVE
Urobilinogen, UA: 0.2 (ref 0.0–1.0)
pH: 7 (ref 5.0–8.0)

## 2023-09-18 LAB — COMPREHENSIVE METABOLIC PANEL
ALT: 26 U/L (ref 0–53)
AST: 20 U/L (ref 0–37)
Albumin: 4.9 g/dL (ref 3.5–5.2)
Alkaline Phosphatase: 67 U/L (ref 39–117)
BUN: 17 mg/dL (ref 6–23)
CO2: 25 meq/L (ref 19–32)
Calcium: 9.8 mg/dL (ref 8.4–10.5)
Chloride: 103 meq/L (ref 96–112)
Creatinine, Ser: 0.89 mg/dL (ref 0.40–1.50)
GFR: 100.53 mL/min (ref >60.00)
Glucose, Bld: 93 mg/dL (ref 70–99)
Potassium: 4 meq/L (ref 3.5–5.1)
Sodium: 137 meq/L (ref 135–145)
Total Bilirubin: 0.6 mg/dL (ref 0.2–1.2)
Total Protein: 7.7 g/dL (ref 6.0–8.3)

## 2023-09-18 LAB — LIPID PANEL
Cholesterol: 226 mg/dL — ABNORMAL HIGH (ref 0–200)
HDL: 37.7 mg/dL — ABNORMAL LOW (ref >39.00)
LDL Cholesterol: 129 mg/dL — ABNORMAL HIGH (ref 0–99)
NonHDL: 188.51
Total CHOL/HDL Ratio: 6
Triglycerides: 297 mg/dL — ABNORMAL HIGH (ref 0.0–149.0)
VLDL: 59.4 mg/dL — ABNORMAL HIGH (ref 0.0–40.0)

## 2023-09-18 NOTE — Patient Instructions (Addendum)
Let us know if you get flu shot or Prevnar 20 (pneumonia shot)   Thrilled you are so strongly considering quitting smoking- one of the best things you can do for your health  Please stop by lab before you go If you have mychart- we will send your results within 3 business days of [place] receiving them.  If you do not have mychart- we will call you about results within 5 business days of [place] receiving them.  *please also note that you will see labs on mychart as soon as they post. I will later go in and write notes on them- will say "notes from Dr. Durene Cal"   Recommended follow up: Return in about 6 months (around 03/17/2024) for followup or sooner if needed.Schedule b4 you leave.

## 2023-09-18 NOTE — Progress Notes (Signed)
Phone: [phone]   Subjective:  Patient presents today for their annual physical. Chief complaint-noted.   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   Patient Active Problem List   Diagnosis Date Noted   Current smoker 11/13/2016    Priority: High   OSA (obstructive sleep apnea) 03/27/2017    Priority: Medium    Hyperlipidemia 10/24/2014    Priority: Medium    Generalized anxiety disorder 10/24/2014    Priority: Medium    Essential hypertension 10/24/2014    Priority: Medium    Past Surgical History:  Procedure Laterality Date   WISDOM TOOTH EXTRACTION      Family History  Problem Relation Age of Onset   Cancer Mother    Stroke Mother        36 at death   Diabetes Mother    Hypertension Mother    Cancer Father    Stroke Father        56 at death   Lung cancer Father        long term smoker   Skin cancer Father    Healthy Sister        15 years older than him   Healthy Brother     Medications- reviewed and updated Current Outpatient Medications  Medication Sig Dispense Refill   clonazePAM (KLONOPIN) 0.5 MG tablet TAKE 1 TABLET BY MOUTH EVERY DAY AS NEEDED FOR ANXIETY 30 tablet 2   lisinopril-hydrochlorothiazide (ZESTORETIC) 20-12.5 MG tablet Take 1 tablet by mouth daily. 90 tablet 3   Multiple Vitamin (MULTIVITAMIN) tablet Take 1 tablet by mouth daily.     Omega-3 Fatty Acids (FISH OIL) 1200 MG CAPS      No current facility-administered medications for this visit.    Allergies-reviewed and updated No Known Allergies  Social History   Social History Narrative    Divorced. With fiancee since 2015 (long term plan). 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    HS degree      Hobbies: golf, fish, travel-beach, [place], enjoys all sports particular hockey and [employer]   Objective  Objective:  BP 122/78   Pulse 92   Temp 97.8 F (36.6 C)   Ht 5\' 10"  (1.778 m)   Wt 187 lb 9.6 oz (85.1 kg)   SpO2 98%   BMI 26.92 kg/m  Gen: NAD, resting comfortably HEENT: Mucous membranes are moist. Oropharynx normal Neck: no thyromegaly CV: RRR no murmurs rubs or gallops Lungs: CTAB no crackles, wheeze, rhonchi Abdomen: soft/nontender/nondistended/normal bowel sounds. No rebound or guarding.  Ext: no edema Skin: warm, dry Neuro: grossly normal, moves all extremities, PERRLA   Assessment and Plan  47 y.o. male presenting for annual physical.  Health Maintenance counseling: 1. Anticipatory guidance: Patient counseled regarding regular dental exams -q6 months, eye exams -yearly,  avoiding smoking and second hand smoke- see below , limiting alcohol to 2 beverages per day -  last year at 12 (up to 20 in past) and currentlydown to 6 per week, no illicit drugs.   2. Risk factor reduction:  Advised patient of need for regular exercise and diet rich and fruits and vegetables to reduce risk of heart attack and stroke.  Exercise- [phone] steps most days- has maintained that.  Diet/weight management-down another 4 lbs from last year- congratulated on ongoing steady weight loss. Still feels has room for improvement in diet Wt Readings from Last 3 Encounters:  09/18/23 187 lb 9.6 oz (85.1 kg)  04/20/23 193 lb 12.8 oz (87.9 kg)  09/10/22 191 lb 3.2 oz (86.7 kg)  3. Immunizations/screenings/ancillary studies- opts out of flu and COVID and Prevnar (he is higher risk with smoking history)- he is still considering flu later- he  may call back about Prevnar 20.  Immunization History  Administered Date(s) Administered   Influenza,inj,Quad PF,6+ Mos 08/30/2019, 09/05/2020, 09/09/2021   Influenza,trivalent, recombinat, inj, PF 08/29/2009, 09/03/2010, 09/06/2012   Influenza-Unspecified 09/03/2014, 08/04/2015, 09/17/2016, 08/21/2017, 07/27/2018   Moderna Sars-Covid-2 Vaccination 06/19/2020, 07/17/2020   Tdap 11/03/2008, 08/29/2009, 02/28/2020  4. Prostate cancer screening-   no family history, start at age 22   5. Colon cancer screening - normal Cologuard 05/05/23 and due in 3 years 6. Skin cancer screening/prevention-has seen dermatology in the past- [facility]. Had mohs with [facility].  advised regular sunscreen use. Denies worrisome, changing, or new skin lesions.  7. Testicular cancer screening- advised monthly self exams   8. STD screening- patient opts  out as monogamous  9. Smoking associated screening-current smoker-we will get urinalysis.   Encouraged cessation-still smoking about quarter pack per day so at least has cut down . Wants to quit by 48.  -AAA screening at 63 planned - Did not tolerate Chantix in the past, wellbutrin has not fully cut down desire   Status of chronic or acute concerns   #social update- cruise over thanksgiving  #hypertension S: medication: Zestoretic 20-12.5Mg  BP Readings from Last 3 Encounters:  09/18/23 122/78  04/20/23 112/74  09/10/22 120/80  A/P: stable- continue current medicines   #hyperlipidemia-LDL over 100 and triglycerides over 400 at times S: Medication: Omega 3 1000Mg  -Coronary artery calcium score 10/02/2021 with score of 0 Lab Results  Component Value Date   CHOL 209 (H) 09/10/2022   HDL 40.90 09/10/2022   LDLCALC 139 (H) 09/05/2020   LDLDIRECT 115.0 09/10/2022   TRIG 329.0 (H) 09/10/2022   CHOLHDL 5 09/10/2022  A/P: lower risk with CT calcium scoring but with smoking still want him to try to continue to work on healthy eating and  regular exercise and recheck lipids today and CT calcium in 2024  # GAD S:Medication: Klonopin 0.5Mg  mostly at night to help with sleep- not always needing Counseling: Has not been beneficial for him in the past Prior to: Cymbalta, Lexapro, Paxil, Zoloft, Remeron, doxepin without benefit, Wellbutrin (poor sleep) A/P: doing well lately- continue current medications    #OSA-compliant with CPAP   Recommended follow up: Return in about 6 months (around 03/17/2024) for followup or sooner if needed.Schedule b4 you leave.  Lab/Order associations: fasting   ICD-10-CM   1. Preventative health care  Z00.00     2. Essential hypertension  I10 Comprehensive metabolic panel    CBC with Differential/Platelet    Lipid panel    3. Generalized anxiety disorder  F41.1     4. Current smoker  F17.200 Urinalysis, Routine w reflex microscopic    5. Hyperlipidemia, unspecified hyperlipidemia  type  E78.5 Comprehensive metabolic panel    CBC with Differential/Platelet    Lipid panel      No orders of the defined types were placed in this encounter.   Return precautions advised.  Tana Conch, MD

## 2023-11-09 DIAGNOSIS — Z08 Encounter for follow-up examination after completed treatment for malignant neoplasm: Secondary | ICD-10-CM | POA: Diagnosis not present

## 2023-11-09 DIAGNOSIS — Z789 Other specified health status: Secondary | ICD-10-CM | POA: Diagnosis not present

## 2023-11-09 DIAGNOSIS — L538 Other specified erythematous conditions: Secondary | ICD-10-CM | POA: Diagnosis not present

## 2023-11-09 DIAGNOSIS — L2989 Other pruritus: Secondary | ICD-10-CM | POA: Diagnosis not present

## 2023-11-09 DIAGNOSIS — L821 Other seborrheic keratosis: Secondary | ICD-10-CM | POA: Diagnosis not present

## 2023-11-09 DIAGNOSIS — L820 Inflamed seborrheic keratosis: Secondary | ICD-10-CM | POA: Diagnosis not present

## 2023-11-09 DIAGNOSIS — D225 Melanocytic nevi of trunk: Secondary | ICD-10-CM | POA: Diagnosis not present

## 2023-11-09 DIAGNOSIS — L814 Other melanin hyperpigmentation: Secondary | ICD-10-CM | POA: Diagnosis not present

## 2024-01-23 ENCOUNTER — Other Ambulatory Visit: Payer: Self-pay | Admitting: Family Medicine

## 2024-02-23 DIAGNOSIS — G4733 Obstructive sleep apnea (adult) (pediatric): Secondary | ICD-10-CM | POA: Diagnosis not present

## 2024-03-18 ENCOUNTER — Ambulatory Visit: Payer: BC Managed Care – PPO | Admitting: Family Medicine

## 2024-03-24 DIAGNOSIS — G4733 Obstructive sleep apnea (adult) (pediatric): Secondary | ICD-10-CM | POA: Diagnosis not present

## 2024-03-31 ENCOUNTER — Encounter: Payer: Self-pay | Admitting: Family Medicine

## 2024-03-31 ENCOUNTER — Ambulatory Visit (INDEPENDENT_AMBULATORY_CARE_PROVIDER_SITE_OTHER): Admitting: Family Medicine

## 2024-03-31 VITALS — BP 132/88 | HR 80 | Temp 98.4°F | Ht 70.0 in | Wt 193.6 lb

## 2024-03-31 DIAGNOSIS — F17200 Nicotine dependence, unspecified, uncomplicated: Secondary | ICD-10-CM

## 2024-03-31 DIAGNOSIS — I10 Essential (primary) hypertension: Secondary | ICD-10-CM

## 2024-03-31 DIAGNOSIS — F411 Generalized anxiety disorder: Secondary | ICD-10-CM

## 2024-03-31 DIAGNOSIS — E785 Hyperlipidemia, unspecified: Secondary | ICD-10-CM | POA: Diagnosis not present

## 2024-03-31 MED ORDER — LISINOPRIL-HYDROCHLOROTHIAZIDE 20-12.5 MG PO TABS: 1.0000 | ORAL_TABLET | Freq: Every day | ORAL | 3 refills | Status: DC

## 2024-03-31 MED ORDER — CLONAZEPAM 0.5 MG PO TABS: ORAL_TABLET | ORAL | 5 refills | Status: DC

## 2024-03-31 NOTE — Progress Notes (Signed)
 Phone [phone] In person visit   Subjective:   Shane Matthews is a 48 y.o. year old very pleasant male patient who presents for/with See problem oriented charting Chief Complaint  Patient presents with   Medical Management of Chronic Issues   Hypertension   Past Medical History-  Patient Active Problem List   Diagnosis Date Noted   Current smoker 11/13/2016    Priority: High   OSA (obstructive sleep apnea) 03/27/2017    Priority: Medium    Hyperlipidemia 10/24/2014    Priority: Medium    Generalized anxiety disorder 10/24/2014    Priority: Medium    Essential hypertension 10/24/2014    Priority: Medium    Medications- reviewed and updated Current Outpatient Medications  Medication Sig Dispense Refill   Multiple Vitamin (MULTIVITAMIN) tablet Take 1 tablet by mouth daily.     Omega-3 Fatty Acids (FISH OIL) 1200 MG CAPS      clonazePAM  (KLONOPIN ) 0.5 MG tablet TAKE 1 TABLET BY MOUTH EVERY DAY AS NEEDED FOR ANXIETY 30 tablet 5   lisinopril -hydrochlorothiazide  (ZESTORETIC ) 20-12.5 MG tablet Take 1 tablet by mouth daily. 90 tablet 3   No current facility-administered medications for this visit.     Objective:  BP 132/88   Pulse 80   Temp 98.4 F (36.9 C)   Ht 5\' 10"  (1.778 m)   Wt 193 lb 9.6 oz (87.8 kg)   SpO2 96%   BMI 27.78 kg/m  Gen: NAD, resting comfortably CV: RRR no murmurs rubs or gallops Lungs: CTAB no crackles, wheeze, rhonchi Ext: no edema Skin: warm, dry     Assessment and Plan   #hypertension S: medication: Zestoretic  20-12.5Mg  at lunch but may try at night - about a week ago at home similar 130s/90 or so BP Readings from Last 3 Encounters:  03/31/24 132/88  09/18/23 122/78  04/20/23 112/74  A/P: blood pressure high acceptable and higher at home recently but hasn't been checking much- encouraged checking when not stressed at least once a week and if notes above 135/85 on average to reach out and we could tweak medications such as possibly 2  of the lisinopril  hydrochlorothiazide  20-12.5 mg for total of 40-25 mg.   #hyperlipidemia-LDL over 100 and triglycerides over 400 at times S: Medication: Omega 3 1000Mg  -Coronary artery calcium score 10/02/2021 with score of 0 -golf and walking neighborhood 3 days a week with dogs Lab Results  Component Value Date   CHOL 226 (H) 09/18/2023   HDL 37.70 (L) 09/18/2023   LDLCALC 129 (H) 09/18/2023   LDLDIRECT 115.0 09/10/2022   TRIG 297.0 (H) 09/18/2023   CHOLHDL 6 09/18/2023  A/P: cholesterol above goal- encouraged healthy eating and regular exercise and recheck coronary artery calcium score in 2027 likely  # GAD S:Medication: Klonopin  0.5Mg  mostly at night to help with sleep. Resting ok on this. Also doing magnesium gummy and seems to help Counseling: Has not been beneficial for him in the past. Prior to: Cymbalta, Lexapro, Paxil, Zoloft, Remeron, doxepin  without benefit, Wellbutrin  (poor sleep) A/P: generalized anxiety disorder reasonable control with primarily clonazepam  at night   #OSA-compliant with CPAP- discussed importance of being on this with clonazepam    #Current smoker-trying to not smoke at work. Slightly less than quarter pack a day- trying to cut down- notes higher appetite. Up 6 lbs. Wants to keep working on cutting down  Recommended follow up: Return for next already scheduled visit or sooner if needed. Future Appointments  Date Time Provider Department Center  09/23/2024  8:00 AM Almira Jaeger, MD [facility]    Lab/Order associations:   ICD-10-CM   1. Essential hypertension  I10     2. Generalized anxiety disorder  F41.1     3. Hyperlipidemia, unspecified hyperlipidemia type  E78.5     4. Current smoker  F17.200       Meds ordered this encounter  Medications   clonazePAM  (KLONOPIN ) 0.5 MG tablet    Sig: TAKE 1 TABLET BY MOUTH EVERY DAY AS NEEDED FOR ANXIETY    Dispense:  30 tablet    Refill:  5    Not to exceed 3 additional fills before  03/07/2024   lisinopril -hydrochlorothiazide  (ZESTORETIC ) 20-12.5 MG tablet    Sig: Take 1 tablet by mouth daily.    Dispense:  90 tablet    Refill:  3   Return precautions advised.  Clarisa Crooked, MD

## 2024-03-31 NOTE — Patient Instructions (Addendum)
 blood pressure high acceptable and higher at home recently but hasn't been checking much- encouraged checking when not stressed at least once a week and if notes above 135/85 on average to reach out and we could tweak medications such as possibly 2 of the lisinopril  hydrochlorothiazide  20-12.5 mg for total of 40-25 mg.   As always encourage quitting smoking- that may also help lower blood pressure   Recommended follow up: Return for next already scheduled visit or sooner if needed.

## 2024-04-24 DIAGNOSIS — G4733 Obstructive sleep apnea (adult) (pediatric): Secondary | ICD-10-CM | POA: Diagnosis not present

## 2024-06-24 ENCOUNTER — Encounter: Payer: Self-pay | Admitting: Family Medicine

## 2024-07-06 DIAGNOSIS — D225 Melanocytic nevi of trunk: Secondary | ICD-10-CM | POA: Diagnosis not present

## 2024-07-06 DIAGNOSIS — L578 Other skin changes due to chronic exposure to nonionizing radiation: Secondary | ICD-10-CM | POA: Diagnosis not present

## 2024-07-06 DIAGNOSIS — L814 Other melanin hyperpigmentation: Secondary | ICD-10-CM | POA: Diagnosis not present

## 2024-07-06 DIAGNOSIS — L821 Other seborrheic keratosis: Secondary | ICD-10-CM | POA: Diagnosis not present

## 2024-09-23 ENCOUNTER — Ambulatory Visit: Payer: Self-pay | Admitting: Family Medicine

## 2024-09-23 ENCOUNTER — Encounter: Payer: Self-pay | Admitting: Family Medicine

## 2024-09-23 ENCOUNTER — Ambulatory Visit (INDEPENDENT_AMBULATORY_CARE_PROVIDER_SITE_OTHER): Payer: BC Managed Care – PPO | Admitting: Family Medicine

## 2024-09-23 VITALS — BP 122/76 | HR 93 | Temp 98.2°F | Resp 14 | Ht 70.0 in | Wt 194.6 lb

## 2024-09-23 DIAGNOSIS — I10 Essential (primary) hypertension: Secondary | ICD-10-CM

## 2024-09-23 DIAGNOSIS — E785 Hyperlipidemia, unspecified: Secondary | ICD-10-CM

## 2024-09-23 DIAGNOSIS — Z131 Encounter for screening for diabetes mellitus: Secondary | ICD-10-CM

## 2024-09-23 DIAGNOSIS — Z0000 Encounter for general adult medical examination without abnormal findings: Secondary | ICD-10-CM

## 2024-09-23 DIAGNOSIS — E663 Overweight: Secondary | ICD-10-CM | POA: Diagnosis not present

## 2024-09-23 DIAGNOSIS — F17200 Nicotine dependence, unspecified, uncomplicated: Secondary | ICD-10-CM

## 2024-09-23 LAB — CBC WITH DIFFERENTIAL/PLATELET
Basophils Absolute: 0.1 K/uL (ref 0.0–0.1)
Basophils Relative: 0.8 % (ref 0.0–3.0)
Eosinophils Absolute: 0.2 K/uL (ref 0.0–0.7)
Eosinophils Relative: 2.8 % (ref 0.0–5.0)
HCT: 43.7 % (ref 39.0–52.0)
Hemoglobin: 15.1 g/dL (ref 13.0–17.0)
Lymphocytes Relative: 31.1 % (ref 12.0–46.0)
Lymphs Abs: 2.4 K/uL (ref 0.7–4.0)
MCHC: 34.6 g/dL (ref 30.0–36.0)
MCV: 92.9 fl (ref 78.0–100.0)
Monocytes Absolute: 0.7 K/uL (ref 0.1–1.0)
Monocytes Relative: 9 % (ref 3.0–12.0)
Neutro Abs: 4.3 K/uL (ref 1.4–7.7)
Neutrophils Relative %: 56.3 % (ref 43.0–77.0)
Platelets: 272 K/uL (ref 150.0–400.0)
RBC: 4.71 Mil/uL (ref 4.22–5.81)
RDW: 13.1 % (ref 11.5–15.5)
WBC: 7.6 K/uL (ref 4.0–10.5)

## 2024-09-23 LAB — URINALYSIS, ROUTINE W REFLEX MICROSCOPIC
Bilirubin Urine: NEGATIVE
Hgb urine dipstick: NEGATIVE
Ketones, ur: NEGATIVE
Leukocytes,Ua: NEGATIVE
Nitrite: NEGATIVE
RBC / HPF: NONE SEEN (ref 0–?)
Specific Gravity, Urine: 1.01 (ref 1.000–1.030)
Total Protein, Urine: NEGATIVE
Urine Glucose: NEGATIVE
Urobilinogen, UA: 0.2 (ref 0.0–1.0)
pH: 7.5 (ref 5.0–8.0)

## 2024-09-23 LAB — COMPREHENSIVE METABOLIC PANEL WITH GFR
ALT: 29 U/L (ref 0–53)
AST: 23 U/L (ref 0–37)
Albumin: 5 g/dL (ref 3.5–5.2)
Alkaline Phosphatase: 70 U/L (ref 39–117)
BUN: 21 mg/dL (ref 6–23)
CO2: 27 meq/L (ref 19–32)
Calcium: 9.7 mg/dL (ref 8.4–10.5)
Chloride: 100 meq/L (ref 96–112)
Creatinine, Ser: 0.94 mg/dL (ref 0.40–1.50)
GFR: 94.42 mL/min (ref >60.00)
Glucose, Bld: 104 mg/dL — ABNORMAL HIGH (ref 70–99)
Potassium: 4.6 meq/L (ref 3.5–5.1)
Sodium: 137 meq/L (ref 135–145)
Total Bilirubin: 0.6 mg/dL (ref 0.2–1.2)
Total Protein: 7.7 g/dL (ref 6.0–8.3)

## 2024-09-23 LAB — LIPID PANEL
Cholesterol: 229 mg/dL — ABNORMAL HIGH (ref 0–200)
HDL: 47.6 mg/dL (ref >39.00)
LDL Cholesterol: 123 mg/dL — ABNORMAL HIGH (ref 0–99)
NonHDL: 181.65
Total CHOL/HDL Ratio: 5
Triglycerides: 293 mg/dL — ABNORMAL HIGH (ref 0.0–149.0)
VLDL: 58.6 mg/dL — ABNORMAL HIGH (ref 0.0–40.0)

## 2024-09-23 LAB — MICROALBUMIN / CREATININE URINE RATIO
Creatinine,U: 64.8 mg/dL
Microalb Creat Ratio: UNDETERMINED mg/g (ref 0.0–30.0)
Microalb, Ur: <0.7 mg/dL

## 2024-09-23 LAB — HEMOGLOBIN A1C: Hgb A1c MFr Bld: 5.5 % (ref 4.6–6.5)

## 2024-09-23 MED ORDER — LISINOPRIL-HYDROCHLOROTHIAZIDE 20-12.5 MG PO TABS: 1.0000 | ORAL_TABLET | Freq: Every day | ORAL | 3 refills | Status: AC

## 2024-09-23 MED ORDER — CLONAZEPAM 0.5 MG PO TABS: ORAL_TABLET | ORAL | 5 refills | Status: AC

## 2024-09-23 NOTE — Patient Instructions (Addendum)
 Please stop by lab before you go If you have mychart- we will send your results within 3 business days of us  receiving them.  If you do not have mychart- we will call you about results within 5 business days of us  receiving them.  *please also note that you will see labs on mychart as soon as they post. I will later go in and write notes on them- will say notes from Dr. Katrinka   As always quitting smoking is one of best things you can do for your long term health  Work on getting weight back into 180's- -thankfully only mild weight gain in last year even with work stress  Recommended follow up: Return in about 6 months (around 03/23/2025) for followup or sooner if needed.Schedule b4 you leave.

## 2024-09-23 NOTE — Progress Notes (Signed)
 Phone: [phone]   Subjective:  Patient presents today for their annual physical. Chief complaint-noted.   See problem oriented charting- ROS- full  review of systems was completed and negative  except for topics noted under acute/chronic concerns  The following were reviewed and entered/updated in epic: Past Medical History:  Diagnosis Date   Anxiety    Chicken pox    GERD (gastroesophageal reflux disease)    sporadic   HTN (hypertension)    Insomnia    Sleep apnea    Squamous cell carcinoma of skin 01/13/2022   dorsum tip of the nose scc insitu   Patient Active Problem List   Diagnosis Date Noted   Tobacco use disorder 11/13/2016    Priority: High   OSA (obstructive sleep apnea) 03/27/2017    Priority: Medium    Hyperlipidemia 10/24/2014    Priority: Medium    Anxiety disorder 10/24/2014    Priority: Medium    Benign hypertension 10/24/2014    Priority: Medium    Insomnia 01/03/2017   Past Surgical History:  Procedure Laterality Date   WISDOM TOOTH EXTRACTION      Family History  Problem Relation Age of Onset   Cancer Mother    Stroke Mother        23 at death   Diabetes Mother    Hypertension Mother    Cancer Father    Stroke Father        25 at death   Lung cancer Father        long term smoker   Skin cancer Father    Healthy Sister        15 years older than him   Healthy Brother     Medications- reviewed and updated Current Outpatient Medications  Medication Sig Dispense Refill   clonazePAM  (KLONOPIN ) 0.5 MG tablet TAKE 1 TABLET BY MOUTH EVERY DAY AS NEEDED FOR ANXIETY 30 tablet 5   cyanocobalamin ([employer] VITAMIN B12) 1000 MCG tablet Take 1,000 mcg by mouth daily.     lisinopril -hydrochlorothiazide  (ZESTORETIC ) 20-12.5 MG tablet Take 1 tablet by mouth daily. 90 tablet 3   Multiple Vitamin (MULTIVITAMIN) tablet Take 1 tablet by mouth daily.     Omega-3 Fatty Acids (FISH OIL) 1200 MG CAPS      No current facility-administered medications for  this visit.    Allergies-reviewed and updated No Known Allergies  Social History   Social History Narrative   Divorced. With fiancee since 2015 (long term plan). 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    HS degree      Hobbies: golf, fish, travel-beach, [place], enjoys all sports particular hockey and [employer]   Objective  Objective:  BP 122/76   Pulse 93   Temp 98.2 F (36.8 C) (Temporal)   Resp 14   Ht 5' 10 (1.778 m)   Wt 194 lb 9.6 oz (88.3 kg)   SpO2 99%   BMI 27.92 kg/m  Gen: NAD, resting comfortably HEENT: Mucous membranes are moist. Oropharynx normal Neck: no thyromegaly CV: RRR no murmurs rubs or gallops Lungs: CTAB no crackles, wheeze, rhonchi Abdomen: soft/nontender/nondistended/normal bowel sounds. No rebound or guarding.  Ext: no edema Skin: warm, dry Neuro: grossly normal, moves all extremities, PERRLA   Assessment and Plan  48 y.o. male presenting for annual physical.  Health Maintenance counseling: 1. Anticipatory guidance: Patient  counseled regarding regular dental exams -q6 months, eye exams - yearly,  avoiding smoking and second hand smoke- see below , limiting alcohol to 2 beverages per day - has maintained lower 6 per week- congratulated, no illicit drugs.   2. Risk factor reduction:  Advised patient of need for regular exercise and diet rich and fruits and vegetables to reduce risk of heart attack and stroke.  Exercise- 05-7999 steps mots days still- golfing and getting out with the dogs.  Diet/weight management-up 7 lbs in last year- encouraged to work back down into 180's at least.  Wt Readings from Last 3 Encounters:  09/23/24 194 lb 9.6 oz (88.3 kg)  03/31/24 193 lb 9.6 oz (87.8 kg)  09/18/23 187 lb 9.6 oz (85.1 kg)  3. Immunizations/screenings/ancillary studies-shingrix , flu, Prevnar 20  declines with upcoming travel Immunization History  Administered Date(s) Administered   Fluzone Influenza virus vaccine,trivalent (IIV3), split virus 08/29/2009, 09/03/2010, 09/06/2012   Influenza,inj,Quad PF,6+ Mos 08/30/2019, 09/05/2020, 09/09/2021   Influenza,trivalent, recombinat, inj, PF 08/29/2009, 09/03/2010, 09/06/2012   Influenza-Unspecified 09/03/2014, 08/04/2015, 09/17/2016, 08/21/2017, 07/27/2018   Moderna Sars-Covid-2 Vaccination 06/19/2020, 07/17/2020   Tdap 11/03/2008, 08/29/2009, 02/28/2020  4. Prostate cancer screening-   no family history, start at age 60    5. Colon cancer screening - normal Cologuard 05/05/23 and due in 3 years 6. Skin cancer screening/prevention-has seen dermatology in the past- [facility]- twice yearly checks. Had mohs with [facility] surgery center.  advised regular sunscreen use. Denies worrisome, changing, or new skin lesions.  7. STD screening- patient opts  out as monogamous  8. Smoking associated screening-current smoker-we will get urinalysis.   Encouraged cessation-still smoking about quart to half pack per day down from peak ppd- 5 cigarettes at work and feels stuck there - he wants to try to cut down  -AAA screening at 63 planned - Did not tolerate Chantix  in the past, wellbutrin  has not fully cut down desire  -under 15 pack years- does not qualify for lung cancer screening  Status of chronic or acute concerns   #social update- very busy with work lately- short staffed and a lot to do- a lot of hours  #hypertension S: medication: Zestoretic  20-12.5Mg    A/P: well controlled continue current medications   #hyperlipidemia-LDL over 100 and triglycerides over 400 at times S: Medication: Omega 3 1000Mg  -Coronary artery calcium score 10/02/2021 with score of 0  Lab Results  Component Value Date   CHOL 226 (H) 09/18/2023   HDL 37.70 (L) 09/18/2023   LDLCALC 129 (H) 09/18/2023   LDLDIRECT 115.0 09/10/2022   TRIG 297.0 (H) 09/18/2023    CHOLHDL 6 09/18/2023  A/P: encouraged healthy eating and regular exercise and weight loss and recheck this today but unlikely to start medicine for cholesterol unless score goes up- recheck 2027 likely and also encouraged quitting smoking  # GAD S:Medication: Klonopin  0.5Mg  mostly in evening to help with sleep/relax Counseling: Has not been beneficial for him in the past Prior to: Cymbalta, Lexapro, Paxil, Zoloft, Remeron, doxepin  without benefit, Wellbutrin  (poor sleep) A/P: reasonable control- continue current medications    #OSA-compliant with CPAP   Recommended follow up: Return in about 6 months (around 03/23/2025) for followup or sooner if needed.Schedule b4 you leave.  Lab/Order associations: fasting   ICD-10-CM   1. Preventative health care  Z00.00     2. Hyperlipidemia, unspecified hyperlipidemia type  E78.5 Comprehensive metabolic panel with GFR    CBC with Differential/Platelet    Lipid panel  3. Benign hypertension  I10 Comprehensive metabolic panel with GFR    CBC with Differential/Platelet    Lipid panel    Microalbumin / creatinine urine ratio    Urinalysis, Routine w reflex microscopic    4. Tobacco use disorder  F17.200 Urinalysis, Routine w reflex microscopic    5. Screening for diabetes mellitus  Z13.1 Hemoglobin A1c    6. Overweight  E66.3 Hemoglobin A1c      No orders of the defined types were placed in this encounter.   Return precautions advised.  Garnette Lukes, MD

## 2025-03-23 ENCOUNTER — Ambulatory Visit: Admitting: Family Medicine

## 2025-10-06 ENCOUNTER — Encounter: Admitting: Family Medicine
